# Patient Record
Sex: Female | Born: 1965
Health system: Southern US, Community
[De-identification: ages and names within clinical notes are randomized; demographics above are authoritative.]

## PROBLEM LIST (undated history)

## (undated) DIAGNOSIS — T7840XA Allergy, unspecified, initial encounter: Secondary | ICD-10-CM

## (undated) DIAGNOSIS — K3580 Unspecified acute appendicitis: Secondary | ICD-10-CM

## (undated) DIAGNOSIS — G43109 Migraine with aura, not intractable, without status migrainosus: Secondary | ICD-10-CM

## (undated) DIAGNOSIS — R519 Headache, unspecified: Secondary | ICD-10-CM

## (undated) DIAGNOSIS — R51 Headache: Secondary | ICD-10-CM

## (undated) DIAGNOSIS — M1990 Unspecified osteoarthritis, unspecified site: Secondary | ICD-10-CM

## (undated) DIAGNOSIS — R7989 Other specified abnormal findings of blood chemistry: Secondary | ICD-10-CM

## (undated) DIAGNOSIS — R011 Cardiac murmur, unspecified: Secondary | ICD-10-CM

## (undated) HISTORY — PX: FOREARM SURGERY: SHX651

## (undated) HISTORY — DX: Other specified abnormal findings of blood chemistry: R79.89

## (undated) HISTORY — DX: Migraine with aura, not intractable, without status migrainosus: G43.109

## (undated) HISTORY — PX: APPENDECTOMY: SHX54

## (undated) HISTORY — DX: Unspecified osteoarthritis, unspecified site: M19.90

## (undated) HISTORY — DX: Allergy, unspecified, initial encounter: T78.40XA

## (undated) HISTORY — DX: Unspecified acute appendicitis: K35.80

## (undated) HISTORY — PX: TONSILLECTOMY: SUR1361

## (undated) HISTORY — PX: TUBAL LIGATION: SHX77

## (undated) HISTORY — PX: WISDOM TOOTH EXTRACTION: SHX21

## (undated) HISTORY — DX: Headache: R51

## (undated) HISTORY — DX: Headache, unspecified: R51.9

---

## 2006-04-02 ENCOUNTER — Ambulatory Visit (HOSPITAL_COMMUNITY): Admission: RE | Admit: 2006-04-02 | Discharge: 2006-04-02 | Payer: Self-pay | Admitting: Internal Medicine

## 2010-04-24 ENCOUNTER — Encounter: Payer: Self-pay | Admitting: Internal Medicine

## 2010-05-18 ENCOUNTER — Inpatient Hospital Stay (INDEPENDENT_AMBULATORY_CARE_PROVIDER_SITE_OTHER)
Admission: RE | Admit: 2010-05-18 | Discharge: 2010-05-18 | Disposition: A | Discharge status: Home / Self Care | Payer: 59 | Source: Ambulatory Visit | Attending: Family Medicine | Admitting: Family Medicine

## 2010-05-18 ENCOUNTER — Ambulatory Visit (INDEPENDENT_AMBULATORY_CARE_PROVIDER_SITE_OTHER): Payer: 59

## 2010-05-18 DIAGNOSIS — R071 Chest pain on breathing: Secondary | ICD-10-CM

## 2010-05-18 LAB — CBC
HCT: 35.8 % — ABNORMAL LOW (ref 36.0–46.0)
Hemoglobin: 12.2 g/dL (ref 12.0–15.0)
MCH: 30.3 pg (ref 26.0–34.0)
MCV: 88.8 fL (ref 78.0–100.0)
Platelets: 309 10*3/uL (ref 150–400)
RDW: 13.4 % (ref 11.5–15.5)

## 2010-05-18 LAB — POCT I-STAT, CHEM 8
BUN: 13 mg/dL (ref 6–23)
Chloride: 104 mEq/L (ref 96–112)
Glucose, Bld: 85 mg/dL (ref 70–99)
Hemoglobin: 13.3 g/dL (ref 12.0–15.0)
Sodium: 141 mEq/L (ref 135–145)
TCO2: 24 mmol/L (ref 0–100)

## 2010-12-12 ENCOUNTER — Inpatient Hospital Stay (INDEPENDENT_AMBULATORY_CARE_PROVIDER_SITE_OTHER)
Admission: RE | Admit: 2010-12-12 | Discharge: 2010-12-12 | Disposition: A | Discharge status: Home / Self Care | Payer: 59 | Source: Ambulatory Visit | Attending: Family Medicine | Admitting: Family Medicine

## 2010-12-12 DIAGNOSIS — J069 Acute upper respiratory infection, unspecified: Secondary | ICD-10-CM

## 2011-05-29 ENCOUNTER — Encounter (HOSPITAL_COMMUNITY): Payer: Self-pay

## 2011-05-29 ENCOUNTER — Emergency Department (INDEPENDENT_AMBULATORY_CARE_PROVIDER_SITE_OTHER)
Admission: EM | Admit: 2011-05-29 | Discharge: 2011-05-29 | Disposition: A | Discharge status: Home / Self Care | Payer: 59 | Source: Home / Self Care | Attending: Family Medicine | Admitting: Family Medicine

## 2011-05-29 DIAGNOSIS — J310 Chronic rhinitis: Secondary | ICD-10-CM

## 2011-05-29 MED ORDER — FLUTICASONE PROPIONATE 50 MCG/ACT NA SUSP: 2.0000 | Freq: Every day | NASAL | Status: DC

## 2011-05-29 NOTE — ED Notes (Signed)
C/o URi type syx for past 3-4 days; no, minimal relief w OTC medications

## 2011-05-29 NOTE — ED Provider Notes (Signed)
History     CSN: [id number]  Arrival date & time 05/29/11  1611   First MD Initiated Contact with Patient 05/29/11 1623      Chief Complaint  Patient presents with  . URI    (Consider location/radiation/quality/duration/timing/severity/associated sxs/prior treatment) HPI Comments: Rayven presents for evaluation of URI symptoms. she reports nasal congestion, postnasal drainage, sinus pressure, and headache. She denies any fever. She does report minimal cough that is nonproductive.  Patient is a 46 y.o. female presenting with URI. The history is provided by the patient.  URI The primary symptoms include sore throat, cough and myalgias. Primary symptoms do not include fever or ear pain. The current episode started 3 to 5 days ago. This is a new problem. The problem has not changed since onset. The sore throat began more than 2 days ago. The sore throat has been unchanged since its onset. The sore throat is mild in intensity. The sore throat is not accompanied by trouble swallowing.  The cough began 3 to 5 days ago. The cough is non-productive.  Symptoms associated with the illness include sinus pressure and congestion. The illness is not associated with chills, facial pain or rhinorrhea.    History reviewed. No pertinent past medical history.  Past Surgical History  Procedure Date  . Tonsillectomy   . Tubal ligation     History reviewed. No pertinent family history.  History  Substance Use Topics  . Smoking status: Never Smoker   . Smokeless tobacco: Not on file  . Alcohol Use: No    OB History    Grav Para Term Preterm Abortions TAB SAB Ect Mult Living                  Review of Systems  Constitutional: Negative.  Negative for fever and chills.  HENT: Positive for congestion, sore throat, sneezing, postnasal drip and sinus pressure. Negative for ear pain, rhinorrhea and trouble swallowing.   Eyes: Negative.   Respiratory: Positive for cough.   Cardiovascular:  Negative.   Gastrointestinal: Negative.   Genitourinary: Negative.   Musculoskeletal: Positive for myalgias.  Skin: Negative.   Neurological: Negative.     Allergies  Codeine and Sulfa antibiotics  Home Medications   Current Outpatient Rx  Name Route Sig Dispense Refill  . FLUTICASONE PROPIONATE 50 MCG/ACT NA SUSP Nasal Place 2 sprays into the nose daily. 16 g 2    BP 115/71  Pulse 68  Temp(Src) 97.9 F (36.6 C) (Oral)  Resp 16  SpO2 100%  LMP 05/24/2011  Physical Exam  Nursing note and vitals reviewed. Constitutional: She is oriented to person, place, and time. She appears well-developed and well-nourished.  HENT:  Head: Normocephalic and atraumatic.  Right Ear: Tympanic membrane is retracted.  Left Ear: Tympanic membrane is retracted.  Mouth/Throat: Uvula is midline, oropharynx is clear and moist and mucous membranes are normal.  Eyes: EOM are normal.  Neck: Normal range of motion.  Pulmonary/Chest: Effort normal and breath sounds normal. She has no decreased breath sounds. She has no wheezes. She has no rhonchi.  Musculoskeletal: Normal range of motion.  Neurological: She is alert and oriented to person, place, and time.  Skin: Skin is warm and dry.  Psychiatric: Her behavior is normal.    ED Course  Procedures (including critical care time)  Labs Reviewed - No data to display No results found.   1. Rhinitis       MDM  rx given for fluticasone  Richardo Priest, MD 05/29/11 1755

## 2011-05-29 NOTE — Discharge Instructions (Signed)
Use nasal spray as directed. Use an over the counter cough syrup, such as Delsym, Robitussin DM, or Mucinex DM, as needed and as directed. I recommend aggressive pain and/or fever control with acetaminophen (Tylenol) and/or ibuprofen. You may use these together, alternating them every 4 hours, or individually, every 8 hours. For example, take acetaminophen 500 to 1000 mg at 12 noon, then 600 to 800 mg of ibuprofen at 4 pm, then acetaminophen at 8 pm, etc. Also, stay hydrated with clear liquids. Return to care should your symptoms not improve, or worsen in any way.

## 2011-07-26 ENCOUNTER — Emergency Department (HOSPITAL_COMMUNITY): Payer: 59

## 2011-07-26 ENCOUNTER — Other Ambulatory Visit: Payer: Self-pay

## 2011-07-26 ENCOUNTER — Observation Stay (HOSPITAL_COMMUNITY)
Admission: EM | Admit: 2011-07-26 | Discharge: 2011-07-27 | Disposition: A | Discharge status: Home / Self Care | Payer: 59 | Attending: Family Medicine | Admitting: Family Medicine

## 2011-07-26 ENCOUNTER — Encounter (HOSPITAL_COMMUNITY): Payer: Self-pay

## 2011-07-26 ENCOUNTER — Observation Stay (HOSPITAL_COMMUNITY): Payer: 59

## 2011-07-26 DIAGNOSIS — R0602 Shortness of breath: Secondary | ICD-10-CM | POA: Insufficient documentation

## 2011-07-26 DIAGNOSIS — R0789 Other chest pain: Secondary | ICD-10-CM

## 2011-07-26 DIAGNOSIS — M79609 Pain in unspecified limb: Secondary | ICD-10-CM | POA: Insufficient documentation

## 2011-07-26 DIAGNOSIS — R079 Chest pain, unspecified: Principal | ICD-10-CM | POA: Insufficient documentation

## 2011-07-26 DIAGNOSIS — R110 Nausea: Secondary | ICD-10-CM | POA: Insufficient documentation

## 2011-07-26 HISTORY — DX: Cardiac murmur, unspecified: R01.1

## 2011-07-26 LAB — CARDIAC PANEL(CRET KIN+CKTOT+MB+TROPI)
CK, MB: 2.8 ng/mL (ref 0.3–4.0)
Total CK: 128 U/L (ref 7–177)

## 2011-07-26 LAB — CBC
HCT: 36.1 % (ref 36.0–46.0)
MCH: 29.3 pg (ref 26.0–34.0)
MCHC: 33.8 g/dL (ref 30.0–36.0)
MCV: 86.6 fL (ref 78.0–100.0)
Platelets: 300 10*3/uL (ref 150–400)
RDW: 14 % (ref 11.5–15.5)
WBC: 6.5 10*3/uL (ref 4.0–10.5)

## 2011-07-26 LAB — POCT I-STAT TROPONIN I: Troponin i, poc: 0 ng/mL (ref 0.00–0.08)

## 2011-07-26 MED ORDER — ACETAMINOPHEN 325 MG PO TABS
650.0000 mg | ORAL_TABLET | ORAL | Status: DC | PRN
  Administered 2011-07-27: 650 mg via ORAL
  Filled 2011-07-26: qty 2

## 2011-07-26 MED ORDER — ASPIRIN 81 MG PO CHEW
324.0000 mg | CHEWABLE_TABLET | Freq: Once | ORAL | Status: AC
  Administered 2011-07-26: 324 mg via ORAL
  Filled 2011-07-26: qty 4

## 2011-07-26 MED ORDER — MORPHINE SULFATE 2 MG/ML IJ SOLN
2.0000 mg | Freq: Once | INTRAMUSCULAR | Status: AC
  Administered 2011-07-26: 2 mg via INTRAVENOUS
  Filled 2011-07-26: qty 1

## 2011-07-26 MED ORDER — MORPHINE SULFATE 2 MG/ML IJ SOLN
2.0000 mg | INTRAMUSCULAR | Status: DC | PRN
  Administered 2011-07-26: 2 mg via INTRAVENOUS

## 2011-07-26 MED ORDER — POTASSIUM CHLORIDE CRYS ER 20 MEQ PO TBCR
40.0000 meq | EXTENDED_RELEASE_TABLET | Freq: Once | ORAL | Status: AC
  Administered 2011-07-26: 40 meq via ORAL

## 2011-07-26 MED ORDER — NITROGLYCERIN 0.4 MG SL SUBL: 0.4000 mg | SUBLINGUAL_TABLET | SUBLINGUAL | Status: DC | PRN

## 2011-07-26 MED ORDER — SODIUM CHLORIDE 0.9 % IV SOLN: 250.0000 mL | INTRAVENOUS | Status: DC | PRN

## 2011-07-26 MED ORDER — POTASSIUM CHLORIDE CRYS ER 20 MEQ PO TBCR
EXTENDED_RELEASE_TABLET | ORAL | Status: AC
Start: 2011-07-26 — End: 2011-07-26
  Filled 2011-07-26: qty 2

## 2011-07-26 MED ORDER — ENOXAPARIN SODIUM 40 MG/0.4ML ~~LOC~~ SOLN
40.0000 mg | SUBCUTANEOUS | Status: DC
  Administered 2011-07-26: 40 mg via SUBCUTANEOUS
  Filled 2011-07-26 (×2): qty 0.4

## 2011-07-26 MED ORDER — MORPHINE SULFATE 2 MG/ML IJ SOLN
INTRAMUSCULAR | Status: AC
  Filled 2011-07-26: qty 1

## 2011-07-26 MED ORDER — ONDANSETRON HCL 4 MG/2ML IJ SOLN: 4.0000 mg | Freq: Four times a day (QID) | INTRAMUSCULAR | Status: DC | PRN

## 2011-07-26 MED ORDER — ASPIRIN EC 81 MG PO TBEC
81.0000 mg | DELAYED_RELEASE_TABLET | Freq: Every day | ORAL | Status: DC
  Administered 2011-07-27: 81 mg via ORAL
  Filled 2011-07-26: qty 1

## 2011-07-26 MED ORDER — SODIUM CHLORIDE 0.9 % IJ SOLN: 3.0000 mL | INTRAMUSCULAR | Status: DC | PRN

## 2011-07-26 MED ORDER — NITROGLYCERIN 0.4 MG SL SUBL
0.4000 mg | SUBLINGUAL_TABLET | SUBLINGUAL | Status: DC | PRN
  Administered 2011-07-26 (×2): 0.4 mg via SUBLINGUAL
  Filled 2011-07-26 (×3): qty 25

## 2011-07-26 MED ORDER — SODIUM CHLORIDE 0.9 % IJ SOLN
3.0000 mL | Freq: Two times a day (BID) | INTRAMUSCULAR | Status: DC
  Administered 2011-07-26 – 2011-07-27 (×2): 3 mL via INTRAVENOUS

## 2011-07-26 NOTE — ED Notes (Signed)
Pt. Reports  Chest pain and pressure unreleaved by nitro "slight headache and little headedness for a few minutes" with previous dose of nitro.

## 2011-07-26 NOTE — ED Provider Notes (Signed)
History     CSN: [id number]  Arrival date & time 07/26/11  1504   First MD Initiated Contact with Patient 07/26/11 1526      Chief Complaint  Patient presents with  . Chest Pain    (Consider location/radiation/quality/duration/timing/severity/associated sxs/prior treatment) Patient is a 46 y.o. female presenting with chest pain. The history is provided by the patient.  Chest Pain The chest pain began 1 - 2 weeks ago. Duration of episode(s) is 30 minutes. Chest pain occurs frequently (up to 10x/day). The chest pain is unchanged. The pain is associated with exertion. At its most intense, the pain is at 10/10. The pain is currently at 5/10. The severity of the pain is moderate. The quality of the pain is described as pressure-like and heavy. The pain radiates to the left arm. Chest pain is worsened by stress and exertion. Primary symptoms include shortness of breath and nausea. Pertinent negatives for primary symptoms include no fatigue, no cough, no abdominal pain and no vomiting.  Pertinent negatives for associated symptoms include no diaphoresis, no lower extremity edema, no near-syncope, no paroxysmal nocturnal dyspnea and no weakness. She tried nothing for the symptoms. Risk factors include no known risk factors.  Pertinent negatives for past medical history include no CAD, no cancer, no diabetes, no hyperlipidemia, no hypertension, no MI and no PE.  Pertinent negatives for family medical history include: no CAD in family and no early MI in family. Procedure history comments: none.     Past Medical History  Diagnosis Date  . Heart murmur     Past Surgical History  Procedure Date  . Tonsillectomy   . Tubal ligation   . Tubal ligation   . Forearm surgery     No family history on file.  History  Substance Use Topics  . Smoking status: Never Smoker   . Smokeless tobacco: Not on file  . Alcohol Use: No    OB History    Grav Para Term Preterm Abortions TAB SAB Ect Mult  Living                  Review of Systems  Constitutional: Negative for diaphoresis and fatigue.  HENT: Negative for neck pain.   Respiratory: Positive for chest tightness and shortness of breath. Negative for cough.   Cardiovascular: Positive for chest pain. Negative for near-syncope.  Gastrointestinal: Positive for nausea. Negative for vomiting, abdominal pain and diarrhea.  Genitourinary: Negative for dysuria.  Skin: Negative for rash.  Neurological: Negative for weakness and headaches.  All other systems reviewed and are negative.    Allergies  Sulfa antibiotics and Codeine  Home Medications   Current Outpatient Rx  Name Route Sig Dispense Refill  . DM-GUAIFENESIN ER 30-600 MG PO TB12 Oral Take 1 tablet by mouth every 12 (twelve) hours.    . ADULT MULTIVITAMIN W/MINERALS CH Oral Take 1 tablet by mouth daily.    Marland Kitchen VITAMIN B-12 1000 MCG PO TABS Oral Take 1,000 mcg by mouth daily.      BP 126/62  Pulse 60  Temp(Src) 98.5 F (36.9 C) (Oral)  Resp 20  Ht 5\' 7"  (1.702 m)  Wt 185 lb (83.915 kg)  BMI 28.97 kg/m2  SpO2 100%  LMP 07/19/2011  Physical Exam  Nursing note and vitals reviewed. Constitutional: She is oriented to person, place, and time. She appears well-developed and well-nourished.  HENT:  Head: Normocephalic and atraumatic.  Eyes: EOM are normal.  Neck: Normal range of motion.  Cardiovascular: Normal  rate, regular rhythm, normal heart sounds and intact distal pulses.   Pulmonary/Chest: Effort normal and breath sounds normal. No respiratory distress.  Abdominal: Soft. There is no tenderness.  Musculoskeletal: Normal range of motion. She exhibits no edema and no tenderness.  Neurological: She is alert and oriented to person, place, and time.  Skin: Skin is warm and dry.  Psychiatric: She has a normal mood and affect.    ED Course  Procedures (including critical care time)  Date: 07/26/2011  Rate: 61  Rhythm: normal sinus rhythm  QRS Axis: normal   Intervals: PR prolonged  ST/T Wave abnormalities: normal  Conduction Disutrbances:none  Narrative Interpretation:   Old EKG Reviewed: none available   Results for orders placed during the hospital encounter of 07/26/11  POCT I-STAT, CHEM 8      Component Value Range   Sodium 141  135 - 145 (mEq/L)   Potassium 3.3 (*) 3.5 - 5.1 (mEq/L)   Chloride 103  96 - 112 (mEq/L)   BUN 9  6 - 23 (mg/dL)   Creatinine, Ser 1.61  0.50 - 1.10 (mg/dL)   Glucose, Bld 90  70 - 99 (mg/dL)   Calcium, Ion 0.96  0.45 - 1.32 (mmol/L)   TCO2 28  0 - 100 (mmol/L)   Hemoglobin 13.3  12.0 - 15.0 (g/dL)   HCT 40.9  81.1 - 91.4 (%)  POCT I-STAT TROPONIN I      Component Value Range   Troponin i, poc 0.00  0.00 - 0.08 (ng/mL)   Comment 3              Labs Reviewed  POCT I-STAT, CHEM 8 - Abnormal; Notable for the following:    Potassium 3.3 (*)    All other components within normal limits  POCT I-STAT TROPONIN I   Dg Chest Portable 1 View  07/26/2011  *RADIOLOGY REPORT*  Clinical Data: Left-sided chest pain  PORTABLE CHEST - 1 VIEW  Comparison: 05/18/2010  Findings: Ill-defined opacities seen in the left apex.  The cardiopericardial silhouette is within normal limits for size. Imaged bony structures of the thorax are intact. Telemetry leads overlie the chest.  IMPRESSION: New ill-defined left apical opacity. This may be related to superimposed density. A follow-up PA and lateral view upright chest x-ray would likely prove helpful to further evaluate. Alternatively, follow-up CT chest without contrast recommended to exclude underlying mass lesion.  I discussed these findings by telephone with Dr. Jeraldine Loots at 1630 hours on 07/26/2011.  Original Report Authenticated By: ERIC A. MANSELL, M.D.     1. Chest pain       MDM  Patient is a 46 year old female presents with intermittent chest pain over the last 1-2 weeks. She describes a pain that typically comes on with exertion or stressful activities. She states  it typically happens up to 10 times a day now. Most recently she had an episode last night where she began to walk on the treadmill and had what she describes as a left-sided chest heaviness or pressure that radiated to her left arm. He continued to walk 30 minutes that felt much more fatigued with doing so. Subsequently increasingly fatigued throughout the rest and continued to have chest pain when she went to bed. Currently she endorses is a 5/10 chest pressure. Associated symptoms have included some nausea but no diaphoresis.  She has no cardiac risk factors including known hypertension, diabetes, hyperlipidemia, family history of CAD, but there is a concerning story for unstable angina.  Here her EKG is without ischemia and her first set of cardiac enzymes is normal.  Given the patient's concerning story for unstable angina cardiology was contacted. They felt the patient stable for a hospitalist service. Patient does not have a PCP and I'm concerned about her getting close followup. Consult to be made to family medicine for further workup. They will admit.       Donnamarie Poag, MD 07/27/11 0005

## 2011-07-26 NOTE — ED Notes (Signed)
Pt presents with 2 week h/o intermittent L sided chest pain.  Pt reports pain has been sharp, felt like pressure with L arm tingling and heavy feeling.  Intermittent nausea and vomiting.

## 2011-07-26 NOTE — ED Notes (Signed)
Admitting Doc at bedside.

## 2011-07-26 NOTE — ED Notes (Signed)
Pt. Reports pain and discomfort relieved by morphine, 0/10.  Hospitalitis at bedside. NAD. A.O. X 4.

## 2011-07-26 NOTE — H&P (Signed)
[facility] Admission History and Physical  Patient name: Denise Reese Medical record number: [id number] Date of birth: 02-10-1966 Age: 46 y.o. Gender: female  Primary Care Provider: No primary provider on file.  Chief Complaint: chest pain History of Present Illness: Denise Reese is a 46 y.o. year old female with no past medical history who presented to the ED with intermitted chest pain x2 weeks.  The pain is left-sided and described as "heaviness."  Has been occuring intermittently over the last several weeks, but has worsened in the last 2 days.  Describes the chest heaviness last night after she started walking on the treadmill. This resolved with rest, but recurred on the morning of admission with activity.  The chest pain this morning was associated with left arm heaviness/tingling, nausea, SOB, and lightheadedness.  No diaphoresis.  She also describes intermittent n/v, difficulty getting a deep breath, and lightheadedness over the last 2 weeks, but has not noticed if these are always associated with the chest pain.  No association with food.  Has been able to eat and drink well.  Vomiting x4 in the last week, none in the last 24 hours.  No fevers, chills, cough, leg pain, leg edema, immobilization, weight loss.  No family history of CAD, MI, CVA, HTN, DM.   Has had similar pain in past (12 years ago, 2 years ago, and 1 year ago).  Has been seen for it at urgent cares and her heart has always been normal.  ED Course: Received SL nitro x3 which decreased pain to a 3/10, 2mg  IV morphine decreased to 1-2/10.  Also received ASA 324mg .  EKG showed 1st degree AV-block and POC troponin was negative.  Past Medical History: Past Medical History  Diagnosis Date  . Heart murmur     Past Surgical History: Past Surgical History  Procedure Date  . Tonsillectomy   . Tubal ligation   . Tubal ligation   . Forearm surgery     Social History: History   Social  History  . Marital Status: Married    Spouse Name: N/A    Number of Children: N/A  . Years of Education: N/A   Social History Main Topics  . Smoking status: Never Smoker   . Smokeless tobacco: None  . Alcohol Use: No  . Drug Use:   . Sexually Active:    Other Topics Concern  . None   Social History Narrative  . None    Family History: No family history on file.  Allergies: Allergies  Allergen Reactions  . Sulfa Antibiotics Shortness Of Breath and Swelling  . Codeine Itching    Current Facility-Administered Medications  Medication Dose Route Frequency Provider Last Rate Last Dose  . aspirin chewable tablet 324 mg  324 mg Oral Once Donnamarie Poag, MD   324 mg at 07/26/11 1607  . morphine 2 MG/ML injection 2 mg  2 mg Intravenous Once Donnamarie Poag, MD   2 mg at 07/26/11 1805  . nitroGLYCERIN (NITROSTAT) SL tablet 0.4 mg  0.4 mg Sublingual Q5 Min x 3 PRN Donnamarie Poag, MD   0.4 mg at 07/26/11 1724   Current Outpatient Prescriptions  Medication Sig Dispense Refill  . dextromethorphan-guaiFENesin (MUCINEX DM) 30-600 MG per 12 hr tablet Take 1 tablet by mouth every 12 (twelve) hours.      . Multiple Vitamin (MULITIVITAMIN WITH MINERALS) TABS Take 1 tablet by mouth daily.      . vitamin B-12 (CYANOCOBALAMIN) 1000 MCG tablet Take  1,000 mcg by mouth daily.       Review Of Systems: Per HPI with the following additions: none Otherwise 12 point review of systems was performed and was unremarkable.  Physical Exam: Pulse: 64  Blood Pressure: 100/68 RR: 20   O2: 100 on RA Temp: 98.5  General: alert, cooperative, appears stated age and no distress HEENT: PERRLA, extra ocular movement intact, sclera clear, anicteric, oropharynx clear, no lesions, neck supple with midline trachea and no LAD Heart: S1, S2 normal, no murmur, rub or gallop, regular rate and rhythm Lungs: clear to auscultation, no wheezes or rales and unlabored breathing Abdomen: abdomen is soft without  significant tenderness, masses, organomegaly or guarding Extremities: extremities normal, atraumatic, no cyanosis or edema Skin:no rashes Neurology: normal without focal findings, mental status, speech normal, alert and oriented x3, PERLA and cranial nerves 2-12 intact  Labs and Imaging: Na 141 K 3.3 Cl 103 BUN 9 Cr 1.10 Ca++ 1.25 Glu 90 TCO2 28 Hgb 13.3 Hct 39.0  POC trop negative  EKG: 1st degree AV-block, nsr  CXR:  New ill-defined left apical opacity. This may be related to  superimposed density. A follow-up PA and lateral view upright chest  x-ray would likely prove helpful to further evaluate.  Alternatively, follow-up CT chest without contrast recommended to  exclude underlying mass lesion.   Assessment and Plan: Denise Reese is a 46 y.o. year old female presenting with chest pain concerning for ACS.  # Chest Pain: History concerning for cardiac origin.  PE unlikely given no tachycardia, hypoxia, or pleuritic chest pain.  GI possible with the nausea and vomiting, but seems unlikely.  MSK causes also seem unlikely given that it is not reproducible on exam.  Psych is possible, but does not really fit picture for panic attacks.  TIMI score of 1. - will admit to step down as still having mild chest pain after SL nitro and IV morphine - SL nitro and IV morphine for pain - zofran prn for nausea - cycle cardiac enzymes - repeat EKG in the morning - will check risk stratification labs  # Possible lung opacity: No pulmonary symptoms or fevers. - 2V CXR  FEN/GI: heart healthy diet, saline lock IV, K-dur x1 Prophylaxis: lovenox Disposition: admit to stepdown  BOOTH, ERIN 07/26/2011, 7:21 PM   I have seen and examined the above pt with Dr Elwyn Reach.  I agree with the above physical exam and assessment and plan as per above.   Denise Reese S. Edmonia James, MD [facility] PGY-3

## 2011-07-26 NOTE — ED Notes (Signed)
Please contact pt's husband Jena Gauss [phone] with any updates or change of status.

## 2011-07-27 LAB — CARDIAC PANEL(CRET KIN+CKTOT+MB+TROPI)
CK, MB: 2.5 ng/mL (ref 0.3–4.0)
CK, MB: 3 ng/mL (ref 0.3–4.0)
Relative Index: 2.2 (ref 0.0–2.5)
Total CK: 119 U/L (ref 7–177)

## 2011-07-27 LAB — CBC
HCT: 34.9 % — ABNORMAL LOW (ref 36.0–46.0)
MCH: 29.3 pg (ref 26.0–34.0)
MCV: 86.6 fL (ref 78.0–100.0)
Platelets: 280 10*3/uL (ref 150–400)
RBC: 4.03 MIL/uL (ref 3.87–5.11)

## 2011-07-27 LAB — BASIC METABOLIC PANEL
BUN: 10 mg/dL (ref 6–23)
CO2: 25 mEq/L (ref 19–32)
Calcium: 9.1 mg/dL (ref 8.4–10.5)
Chloride: 102 mEq/L (ref 96–112)
Creatinine, Ser: 0.92 mg/dL (ref 0.50–1.10)
Glucose, Bld: 101 mg/dL — ABNORMAL HIGH (ref 70–99)

## 2011-07-27 LAB — MRSA PCR SCREENING: MRSA by PCR: NEGATIVE

## 2011-07-27 LAB — LIPID PANEL
HDL: 56 mg/dL (ref >39)
LDL Cholesterol: 74 mg/dL (ref 0–99)
Total CHOL/HDL Ratio: 2.8 RATIO
VLDL: 24 mg/dL (ref 0–40)

## 2011-07-27 LAB — TSH: TSH: 6.236 u[IU]/mL — ABNORMAL HIGH (ref 0.350–4.500)

## 2011-07-27 MED ORDER — OMEPRAZOLE 40 MG PO CPDR: 40.0000 mg | DELAYED_RELEASE_CAPSULE | Freq: Every day | ORAL | Status: DC

## 2011-07-27 NOTE — Progress Notes (Signed)
Subjective: This is a 46 yo woman presenting for 2 weeks of progressive left-sided chest pain, described as heavy and radiating throughout the left upper extremity, especially worse in the past 2 days. At that time she also described shortness of breath without any pain. After receiving sublingual Nitro her pain was noted to decrease in severity from 7 to 3.  This was further relieved by morphine. This morning she has no complaints of pain or dyspnea and feels her condition has much improved.    Objective: Vital signs in last 24 hours: Temp:  [97.3 F (36.3 C)-98.5 F (36.9 C)] 97.7 F (36.5 C) (04/25 0846) Pulse Rate:  [60-94] 61  (04/25 0846) Resp:  [18-20] 18  (04/25 0846) BP: (100-126)/(53-77) 113/63 mmHg (04/25 0846) SpO2:  [95 %-100 %] 96 % (04/25 0846) Weight:  [185 lb (83.915 kg)-195 lb 1.7 oz (88.5 kg)] 193 lb 2 oz (87.6 kg) (04/25 0733) Weight change:  Last BM Date: 07/26/11  Intake/Output from previous day: 04/24 0701 - 04/25 0700 In: 320 [P.O.:220; I.V.:100] Out: 1100 [Urine:1100] Intake/Output this shift:    General appearance: alert, cooperative and appears stated age Resp: clear to auscultation bilaterally Cardio: regular rate and rhythm, S1, S2 normal, no murmur, click, rub or gallop GI: soft, non-tender; bowel sounds normal; no masses,  no organomegaly Pulses: 2+ and symmetric  Lab Results:  Basename 07/27/11 0256 07/26/11 2137  WBC 6.2 6.5  HGB 11.8* 12.2  HCT 34.9* 36.1  PLT 280 300   BMET  Basename 07/27/11 0256 07/26/11 2137 07/26/11 1612  NA 136 -- 141  K 3.6 -- 3.3*  CL 102 -- 103  CO2 25 -- --  GLUCOSE 101* -- 90  BUN 10 -- 9  CREATININE 0.92 0.89 --  CALCIUM 9.1 -- --    Studies/Results: Dg Chest 2 View  07/26/2011  *RADIOLOGY REPORT*  Clinical Data: Left-sided upper chest pain for the past 2 weeks.  CHEST - 2 VIEW  Comparison: Portable chest 07/26/2011 at 1606 hours  Findings: Focal increased density noted on the previous study in the  left apical region appears to extend outside of the chest and likely represents hair or clothing shadow.  Normal heart size and pulmonary vascularity.  No focal airspace consolidation in the lungs.  No blunting of costophrenic angles.  IMPRESSION: Shadow over the left apical region and extends outside the chest and likely represents a hair braid.  No evidence of active pulmonary disease.  Original Report Authenticated By: Marlon Pel, M.D.   Dg Chest Portable 1 View  07/26/2011  *RADIOLOGY REPORT*  Clinical Data: Left-sided chest pain  PORTABLE CHEST - 1 VIEW  Comparison: 05/18/2010  Findings: Ill-defined opacities seen in the left apex.  The cardiopericardial silhouette is within normal limits for size. Imaged bony structures of the thorax are intact. Telemetry leads overlie the chest.  IMPRESSION: New ill-defined left apical opacity. This may be related to superimposed density. A follow-up PA and lateral view upright chest x-ray would likely prove helpful to further evaluate. Alternatively, follow-up CT chest without contrast recommended to exclude underlying mass lesion.  I discussed these findings by telephone with Dr. Jeraldine Loots at 1630 hours on 07/26/2011.  Original Report Authenticated By: ERIC A. MANSELL, M.D.    Medications: I have reviewed the patient's current medications.  Assessment/Plan: This is a 46 yo woman with resolving chest pain. 1. Chest Pain  EKG- 1st degree AV block, no ischemia  Cardiac Enzymes- Troponin I negative x 3, all others  negative x 2  Consider stress test 2. Elevated TSH  Pending Free T3 and T4  No s/s of hypo/hyperthyroidism 3. Abnormal CXR  Opacity noted initially in left upper lung field was determined to be artifact (hair braid).  LOS: 1 day   Durenda Age 07/27/2011, 8:59 AM   PGY 2 Addendum to Student Progress Note: I have seen and examined this patient and agree with Student's note.   S: Patient denies chest pain this am.   O:  BP 119/71   Pulse 69  Temp(Src) 97.7 F (36.5 C) (Oral)  Resp 18  Ht 5\' 7"  (1.702 m)  Wt 193 lb 2 oz (87.6 kg)  BMI 30.25 kg/m2  SpO2 96%  LMP 07/19/2011 General appearance: alert, cooperative and no distress Lungs: clear to auscultation bilaterally Heart: regular rate and rhythm, S1, S2 normal, no murmur, click, rub or gallop Abdomen: soft, non-tender; bowel sounds normal; no masses,  no organomegaly Extremities: extremities normal, atraumatic, no cyanosis or edema Pulses: 2+ and symmetric  A/P: 46 year old female with chest pain:  1) Chest pain- ruled out for AMI, patient without significant risk factors for coronary disease.  Feel this is more likely GI source of pain, will discharge with PPI. Will have her follow up as an outpatient, and consider outpatient stress test if pain continues.  2) Elevated TSH- have ordered T3 and T4, will follow up for need of thyroid medication in clinic.  3) Disposition- Discharge home.   Gurjot Brisco 07/27/2011 1:14 PM

## 2011-07-27 NOTE — H&P (Signed)
[facility]MT[facility] Attending Admission Note  Patient seen and examined by me, discussed with resident team.  Briefly, a 45yoF without known heart disease who presents with intermittent chest "heaviness", onset of this episode Saturday, April 20th while walking on [employer].  Heaviness that radiates to L upper arm; not associated with diaphoresis or nausea.  Went away on Saturday after she rested.  The following day she had onset of sharp left-sided chest pain while in church, different in characteristic from the preceding day.  On Monday April 22, she had nausea and emesis x4 withotu chest pain, but felt somewhat light-headed,  No fever or chills, no cough or dyspnea.  Tuesday evening she attempted to exercise on her home treadmill, was unable to get to her usual speed and then had onset of left sided chest heaviness and arm pain again, which went away after a shower and rest.  Yesterday this pain persisted, and prompted her to present to urgent care in [place] near her work, was recommended for admission.   In ED at [facility] she was given sl NTG x2 as well as morphine, with resolution of the pain.  Has had negative cardiac enzymes and ECGs that are remarkable only for a first degree AV block; no suggestion of ischemia.  This morning with mild headache, no chest discomfort or heaviness.   PE Well appearing, detailed historian, no apparent distress HEENT Neck supple, no adenopathy COR S1S2, no reproducible pain with palpation over sternum, chest wall or with shoulder movements/palpation PULM Clear bilaterally, no rales or wheezes ABD Soft, nontender, nondistended, no masses or megaly.   A/P: Patient who lacks significant risk factors (nonsmoker, no family hx of MI or DM; favorable lipid panel this morning), admitted for chest pain.  Her pretest probability for cardiac disease is low; however, her story for anginal-type pain with onset during physical activity warrants consideration for cardiac causes.  She has  had negative CEs and ECG since admission, and is now chest-pain free.  Would consider whether outpatient exercise stress test would be helpful to further stratify her risk in light of her symptom description.  Paula Compton, MD

## 2011-07-27 NOTE — ED Provider Notes (Signed)
  I performed a history and physical examination of Denise Reese and discussed her management with Dr. Vear Clock.  I agree with the history, physical, assessment, and plan of care, with the following exceptions: None  I was present for the following procedures: None Time Spent in Critical Care of the patient: None Time spent in discussions with the patient and family: 10  Cardiac: 60 sr- normal Pulse ox : 99% ra-normal  I agree with the ECG interpretation   Ivonne Freeburg, Elvis Coil, MD 07/27/11 [phone]

## 2011-07-27 NOTE — Discharge Instructions (Signed)
Chest Pain (Nonspecific) It is often hard to give a specific diagnosis for the cause of chest pain. There is always a chance that your pain could be related to something serious, such as a heart attack or a blood clot in the lungs. You need to follow up with your caregiver for further evaluation. CAUSES   Heartburn.   Pneumonia or bronchitis.   Anxiety or stress.   Inflammation around your heart (pericarditis) or lung (pleuritis or pleurisy).   A blood clot in the lung.   A collapsed lung (pneumothorax). It can develop suddenly on its own (spontaneous pneumothorax) or from injury (trauma) to the chest.   Shingles infection (herpes zoster virus).  The chest wall is composed of bones, muscles, and cartilage. Any of these can be the source of the pain.  The bones can be bruised by injury.   The muscles or cartilage can be strained by coughing or overwork.   The cartilage can be affected by inflammation and become sore (costochondritis).  DIAGNOSIS  Lab tests or other studies, such as X-rays, electrocardiography, stress testing, or cardiac imaging, may be needed to find the cause of your pain.  TREATMENT   Treatment depends on what may be causing your chest pain. Treatment may include:   Acid blockers for heartburn.   Anti-inflammatory medicine.   Pain medicine for inflammatory conditions.   Antibiotics if an infection is present.   You may be advised to change lifestyle habits. This includes stopping smoking and avoiding alcohol, caffeine, and chocolate.   You may be advised to keep your head raised (elevated) when sleeping. This reduces the chance of acid going backward from your stomach into your esophagus.   Most of the time, nonspecific chest pain will improve within 2 to 3 days with rest and mild pain medicine.  HOME CARE INSTRUCTIONS   If antibiotics were prescribed, take your antibiotics as directed. Finish them even if you start to feel better.   For the next few  days, avoid physical activities that bring on chest pain. Continue physical activities as directed.   Do not smoke.   Avoid drinking alcohol.   Only take over-the-counter or prescription medicine for pain, discomfort, or fever as directed by your caregiver.   Follow your caregiver's suggestions for further testing if your chest pain does not go away.   Keep any follow-up appointments you made. If you do not go to an appointment, you could develop lasting (chronic) problems with pain. If there is any problem keeping an appointment, you must call to reschedule.  SEEK MEDICAL CARE IF:   You think you are having problems from the medicine you are taking. Read your medicine instructions carefully.   Your chest pain does not go away, even after treatment.   You develop a rash with blisters on your chest.  SEEK IMMEDIATE MEDICAL CARE IF:   You have increased chest pain or pain that spreads to your arm, neck, jaw, back, or abdomen.   You develop shortness of breath, an increasing cough, or you are coughing up blood.   You have severe back or abdominal pain, feel nauseous, or vomit.   You develop severe weakness, fainting, or chills.   You have a fever.  THIS IS AN EMERGENCY. Do not wait to see if the pain will go away. Get medical help at once. Call your local emergency services (911 in [place] Do not drive yourself to the hospital. MAKE SURE YOU:   Understand these instructions.     Will watch your condition.   Will get help right away if you are not doing well or get worse.  Document Released: 12/28/2004 Document Revised: 03/09/2011 Document Reviewed: 10/24/2007 Exit[employer] Patient Information 2012 [employer]. 

## 2011-07-27 NOTE — Progress Notes (Signed)
Uneventful noc.  Now free of chest pressure since last dose of morphine in ER at 8pm.  Tele showed 3 episodes of P wave with no QRS following.  Otherwise 1st degree HB w/ rate of 50-60 at rest and 80's w/ activity in room.

## 2011-08-03 ENCOUNTER — Ambulatory Visit (INDEPENDENT_AMBULATORY_CARE_PROVIDER_SITE_OTHER): Payer: 59 | Admitting: Family Medicine

## 2011-08-03 ENCOUNTER — Encounter: Payer: Self-pay | Admitting: Family Medicine

## 2011-08-03 VITALS — BP 134/82 | HR 82 | Ht 67.0 in | Wt 199.0 lb

## 2011-08-03 DIAGNOSIS — R7989 Other specified abnormal findings of blood chemistry: Secondary | ICD-10-CM

## 2011-08-03 DIAGNOSIS — R0789 Other chest pain: Secondary | ICD-10-CM

## 2011-08-03 DIAGNOSIS — I10 Essential (primary) hypertension: Secondary | ICD-10-CM

## 2011-08-03 LAB — T4, FREE: Free T4: 1 ng/dL (ref 0.80–1.80)

## 2011-08-03 NOTE — Progress Notes (Addendum)
Subjective:     Patient ID: Denise Reese, female   DOB: [birth year], 46 y.o.   MRN: [id number]  HPI Denise Reese is a pleasant 46 year old woman who presents as a follow-up to a recent overnight admission for chest pain- left sided. Cardiac origin was ruled out by negative cardiac enzymes x 3 and EKG showing only 1st degree heart block.  There was noted to be an elevated TSH to 6. She reports repeated episodes of heaviness in her chest and left arm, which she finds to be associated with emotionally stressful times. She acknowledges taking several supplements including: TaiSlim (metab/energy), B12, B-complex, Glucosamine, Chondroitin, Fish oil, Vit E, Resveratol (metab/energy), and a daily vitamin that promotes metabolism and energy. She has found that relaxing and "redirecting" her thoughts seems to bring relief of this present chest pressure. She has been able to do low-grade aerobic exercise on a treadmill for 30 to 60 minutes without return of symptoms. She has been taking omeprazole 40mg  daily since discharge. She does not notice any difference.  No chest pain at time of visit.  Chest pain is not worse with walking/exercise. Can occur at rest if feeling stressed. PHQ-9 score of 6-  Daily decrease in energy, more than 1/2 of the time problems with appetite, and several days in past 2 weeks has had problems with sleep.  No history of depression.   Review of Systems As stated in HPI.    Objective:   Physical Exam  Constitutional: She is oriented to person, place, and time. She appears well-developed and well-nourished.  HENT:  Head: Normocephalic and atraumatic.  Cardiovascular: Normal rate and regular rhythm.  Exam reveals no gallop and no friction rub.   No murmur heard. Pulmonary/Chest: Effort normal and breath sounds normal. No respiratory distress. She has no wheezes. She has no rales.  Abdominal: Soft. Bowel sounds are normal.  Neurological: She is alert and oriented to person, place, and  time.       Assessment:        Plan:

## 2011-08-03 NOTE — Patient Instructions (Signed)
It was a pleasure seeing you again! It is best if you stop the TaiSlim, Resveratol (already stopped), and Men's multivitamin. As we talked about, the FDA doesn't regulate this and it may have other stimulants or unknown amounts that could be causing your chest pain. Today you will have your thyroid stimulating hormone re-checked to see if there is in fact a problem with how your thyroid is functioning. We will also make an appointment for you to get a treadmill stress test to do a final evaluation for any possible heart-related pain. At a future appointment we can talk about weight loss.

## 2011-08-04 ENCOUNTER — Telehealth: Payer: Self-pay | Admitting: *Deleted

## 2011-08-04 NOTE — Telephone Encounter (Signed)
Patient came by office  asking about lab results and when she needs to come back to follow up with Dr. Edmonia James.  States she has not felt well today . She had a headache earlier today and left work . Took medications and headache went away.  She is feeling ok now. Also she is concerned about a change in her periods for last few month . Periods have been irregular. Advised patient that I will send message to MD to call her about her labs and follow up. Marland Kitchen  Phone  # [phone]

## 2011-08-05 ENCOUNTER — Encounter: Payer: Self-pay | Admitting: Family Medicine

## 2011-08-05 DIAGNOSIS — R079 Chest pain, unspecified: Secondary | ICD-10-CM | POA: Insufficient documentation

## 2011-08-05 DIAGNOSIS — I10 Essential (primary) hypertension: Secondary | ICD-10-CM | POA: Insufficient documentation

## 2011-08-05 DIAGNOSIS — R7989 Other specified abnormal findings of blood chemistry: Secondary | ICD-10-CM | POA: Insufficient documentation

## 2011-08-05 DIAGNOSIS — R0789 Other chest pain: Secondary | ICD-10-CM | POA: Insufficient documentation

## 2011-08-05 HISTORY — DX: Other specified abnormal findings of blood chemistry: R79.89

## 2011-08-05 NOTE — Assessment & Plan Note (Signed)
134/82 at today's appointment- will continue to monitor.

## 2011-08-05 NOTE — Assessment & Plan Note (Signed)
Denise Reese is a 46 year old woman following up on her chest pain which resulted in previous hospital admission.  Inpatient work up negative.  Chest pain is atypical and seems worse with emotional stress.  Pt instructed to stop taking metabolism and energy inducing supplements.  Continue to perform  stress-reducing mental exercises when feeling stressed. Although workup in hospital was reassuring that not cardiac etiology I still feel it best continue work up with outpatient stress test.  Will arrange for this and have pt follow up with me after stress test to review results.  Return sooner if no improvement or if new or worsening of symptoms.

## 2011-08-05 NOTE — Assessment & Plan Note (Signed)
Will recheck TSH- will also obtain T3 and T4 levels.

## 2011-08-06 NOTE — Telephone Encounter (Signed)
Called patient.   Told patient the results are all normal.  I want to see her back in 1-2 weeks or Return sooner if  new or worsening of symptoms. Told her that office staff will be calling her with appointment for outpatient exercise treadmill test.

## 2011-08-10 ENCOUNTER — Telehealth: Payer: Self-pay | Admitting: Family Medicine

## 2011-08-10 NOTE — Telephone Encounter (Signed)
Pt is supposed to have a stress test and she hasn't heard from them yet  - not sure what to do.

## 2011-08-11 NOTE — Telephone Encounter (Signed)
Has this stress test been approved by Dr Jennette Kettle, Darrick Penna, or McDiarmid??Jenny Omdahl, Rodena Medin

## 2011-08-13 NOTE — Telephone Encounter (Signed)
Dr. Jennette Kettle,  This is the patient that I send a note to you about via your inbox last week.  Do agree that she is an appropriate referral for exercise stress test?  Thanks, [employer]

## 2011-08-14 NOTE — Telephone Encounter (Signed)
Denise Reese can you help set up the stress test for this patient, thanks.Random Dobrowski, Rodena Medin

## 2011-08-14 NOTE — Telephone Encounter (Signed)
OK to set up stress test---let [place] Know I have approved. THANKS! Denny Levy

## 2011-08-15 ENCOUNTER — Encounter: Payer: Self-pay | Admitting: Family Medicine

## 2011-08-15 ENCOUNTER — Ambulatory Visit (INDEPENDENT_AMBULATORY_CARE_PROVIDER_SITE_OTHER): Payer: 59 | Admitting: Family Medicine

## 2011-08-15 VITALS — BP 117/76 | HR 68 | Ht 67.0 in | Wt 198.0 lb

## 2011-08-15 DIAGNOSIS — R0789 Other chest pain: Secondary | ICD-10-CM

## 2011-08-15 NOTE — Patient Instructions (Signed)
We will give you the appointment for the exercise stress test.  Continue to do de-stressing activities- such as prayer, christian music, etc.   Return in 1-2 months for annual physical.  Or sooner if needed.

## 2011-08-15 NOTE — Progress Notes (Signed)
  Subjective:    Patient ID: Denise Reese, female    DOB: 08/27/1965, 46 y.o.   MRN: [id number]  HPI Followup chest pain: Patient states that during the past 2 weeks she has not had any chest pain associated with activity. She has had some chest pressure that only occurs when she is feeling stressed-in her work situation or with her daughter in a disagreement. When chest pressure occurs she calms herself down using prayer or distinct music and chest pressure immediately resolves. She worked on her garden for her 5 hours in a row over the weekend and had no chest pain. Has and walking on treadmill and  working in her garden consistently without any chest pain. No episodes of shortness of breath. No diaphoresis. No nausea. No vomiting. Patient denies any symptoms of depression.-PHQ9 obtained at last appointment.   Review of Systems As per above.    Objective:   Physical Exam  Constitutional: She appears well-developed and well-nourished.  HENT:  Head: Normocephalic and atraumatic.  Eyes: Pupils are equal, round, and reactive to light.  Cardiovascular: Normal rate, regular rhythm and normal heart sounds.   No murmur heard. Pulmonary/Chest: Effort normal. No respiratory distress. She has no wheezes. She has no rales. She exhibits no tenderness.  Musculoskeletal: She exhibits no edema.  Neurological: She is alert.  Skin: No rash noted.  Psychiatric: She has a normal mood and affect.          Assessment & Plan:

## 2011-08-16 ENCOUNTER — Telehealth: Payer: Self-pay | Admitting: *Deleted

## 2011-08-16 NOTE — Assessment & Plan Note (Signed)
Atypical chest pain most likely related to stress reaction.  Inpatient work up negative for cardiac cause.  Outpt. Treadmill stress test pending.  Pt to continue to do relaxation techniques to relieve chest pressure.  Reviewed red flags for return- chest pain with activity, relieved with rest, substernal, etc.  Will review stress test results when they are available.  Pt to return in 1 month for annual wellness exam.

## 2011-08-16 NOTE — Telephone Encounter (Signed)
Called patient and informed her of appointment for ETT on 09/08/11 at 11:15am. She was given the blue information sheet at her office visit yesterday.Phinneas Shakoor, Rodena Medin

## 2011-09-08 ENCOUNTER — Ambulatory Visit (INDEPENDENT_AMBULATORY_CARE_PROVIDER_SITE_OTHER): Payer: 59 | Admitting: Family Medicine

## 2011-09-08 ENCOUNTER — Ambulatory Visit (HOSPITAL_COMMUNITY)
Admission: RE | Admit: 2011-09-08 | Discharge: 2011-09-08 | Disposition: A | Discharge status: Home / Self Care | Payer: 59 | Source: Ambulatory Visit | Attending: Family Medicine | Admitting: Family Medicine

## 2011-09-08 DIAGNOSIS — R0789 Other chest pain: Secondary | ICD-10-CM | POA: Insufficient documentation

## 2011-09-08 NOTE — Progress Notes (Signed)
Patient ID: Denise Reese, female   DOB: 1966/03/20, 45 y.o.   MRN: [id number] The Graded Exercise Test was EQUIVOCAL / POSITIVE for signs of ischemia. The patient achieved and exercise level of  12 mets. Predicted maximal  heart rate  was  176 85% - 90% Maximal  Heart Rate was : 149  Pretest Likelihood that this patient has coronary artery disease (CAD), given their symptoms and risk profile was estimated to be  30% Post test likelihood of this patient having CAD was estimated to be  45%.  TEST SPECIFICS: Maximum heart rate achieved  176 Exercise effort  GOOD Symptoms with exercise?  NONE Blood pressure response to exercise?  ATYPICAL WITH A DROP IN DIASTOLIC BLOOD PRESSURE FROM A STARTING 118/89 TO 150/63 DURING STAGE 3 AT MAX hr (169);  IN RECOVERY MINUTE ,2 THE BP WAS 155/72 AT PULSE 95. This is a fairly  typical systolic response, although mildly attenuated with an increase of 32 points BP for 12 METS; there was more of a drop in diastolic than expected although the initial BP was read as 118/90 the first BP recorded on strip was actually 113/69 so this may be artifact. EKG response to exercise:  Upsloping St depression that did not meet criteria was noted in initial stage three. Mid stage three there was horizontal ST depression in V4 and V5 that  was right at 2 mm depression.  COMMENTS:  Review of her EKG and alarm reviews from her inpatient stay showed first degree AV block at rest with a few episodes of Second degree type 1 (wenkebach) on the monitor,  There are enough items in her evaluation that make me concerned. I recommend she be further evaluated by Cardiology.

## 2011-09-14 ENCOUNTER — Telehealth: Payer: Self-pay | Admitting: *Deleted

## 2011-09-14 ENCOUNTER — Other Ambulatory Visit: Payer: Self-pay | Admitting: Family Medicine

## 2011-09-14 DIAGNOSIS — R079 Chest pain, unspecified: Secondary | ICD-10-CM

## 2011-09-14 NOTE — Telephone Encounter (Signed)
Left message for patient to return call. Please tell her she has an appointment with [facility] on 09/26/11 at 2:30 with Dr [place].Melba Araki, Rodena Medin

## 2011-09-15 NOTE — Telephone Encounter (Signed)
Called patient and informed her of appointment with cardiology. She says she does not feel she needs to see a cardiologist and is worried this is going to add too much to her hospital bill. She has an upcoming appointment with Dr Edmonia James on the 21st of this month so I told her not to cancel that appointment until she discusses it with [place] at her next appointment.Delfino Friesen, Rodena Medin

## 2011-09-22 ENCOUNTER — Ambulatory Visit (INDEPENDENT_AMBULATORY_CARE_PROVIDER_SITE_OTHER): Payer: 59 | Admitting: Family Medicine

## 2011-09-22 ENCOUNTER — Other Ambulatory Visit (HOSPITAL_COMMUNITY)
Admission: RE | Admit: 2011-09-22 | Discharge: 2011-09-22 | Disposition: A | Discharge status: Home / Self Care | Payer: 59 | Source: Ambulatory Visit | Attending: Family Medicine | Admitting: Family Medicine

## 2011-09-22 ENCOUNTER — Encounter: Payer: Self-pay | Admitting: Family Medicine

## 2011-09-22 VITALS — BP 129/77 | HR 62 | Temp 98.1°F | Ht 67.0 in | Wt 200.0 lb

## 2011-09-22 DIAGNOSIS — R0789 Other chest pain: Secondary | ICD-10-CM

## 2011-09-22 DIAGNOSIS — I10 Essential (primary) hypertension: Secondary | ICD-10-CM

## 2011-09-22 DIAGNOSIS — Z0000 Encounter for general adult medical examination without abnormal findings: Secondary | ICD-10-CM

## 2011-09-22 DIAGNOSIS — E663 Overweight: Secondary | ICD-10-CM

## 2011-09-22 DIAGNOSIS — Z01419 Encounter for gynecological examination (general) (routine) without abnormal findings: Secondary | ICD-10-CM | POA: Insufficient documentation

## 2011-09-22 DIAGNOSIS — Z124 Encounter for screening for malignant neoplasm of cervix: Secondary | ICD-10-CM

## 2011-09-22 NOTE — Progress Notes (Signed)
  Subjective:    Patient ID: Denise Reese, female    DOB: 02-19-1966, 46 y.o.   MRN: [id number]  HPI Patient here for routine physical:  PMH, Surgical history, Family History, allergies, medications and social history all updated under appropriate tabs.    Health maintenance: Patient had all lab work done at recent hospitalization-no needed at this time.-LDL was 74. Mammogram-handout given-patient had a mammogram 2 years ago due this year. Patient unsure of last tetanus vaccine-will check records and let us know  Weight management: Patient is exercising 3-6 times per week possibly 30 minutes at a time. Walks, uses treadmill, or uses [employer]. Is eating 3 fruits and vegetables per day.a chest pain. No shortness of breath.  Blood pressure followup: Concern for high blood pressure while in hospital. Blood pressure today 129/77. Also within normal limits at last appointment. Not any medication. No headaches. No dizziness. No vision changes.  Chest pain followup: Patient went to treadmill stress test with Dr. Jennette Kettle.  Some nonspecific ST changes identified during the end portion of the treadmill stress test. It was recommended that patient be seen by cardiology for further workup. Patient states that she has not had any further chest pain. Chest pain when it did occur was easily resolvable with slow breathing and removing herself from anxiety provoking situations. Patient states she does not want to be referred to cardiology at this time. Do to cost and the fact that she will have had a pocket and because her symptoms are improved.  Smoking status reviewed.   Review of Systems As per above    Objective:   Physical Exam  Constitutional: She appears well-developed and well-nourished.  HENT:  Head: Normocephalic and atraumatic.  Eyes: Pupils are equal, round, and reactive to light.  Neck: Normal range of motion. No thyromegaly present.  Cardiovascular: Normal rate, regular rhythm and normal  heart sounds.   No murmur heard. Pulmonary/Chest: Effort normal and breath sounds normal. No respiratory distress. She has no wheezes. She has no rales.  Abdominal: Soft. She exhibits no distension. There is no tenderness.  Genitourinary: Vagina normal and uterus normal. No breast swelling (no lumps or nodules bilateral), tenderness, discharge or bleeding. There is no rash, tenderness, lesion or injury on the right labia. There is no rash, tenderness, lesion or injury on the left labia. Cervix exhibits no motion tenderness, no discharge and no friability. Right adnexum displays no mass, no tenderness and no fullness. Left adnexum displays no mass, no tenderness and no fullness.  Musculoskeletal: She exhibits no edema.  Neurological: She is alert.  Skin: No rash noted.  Psychiatric: She has a normal mood and affect.          Assessment & Plan:

## 2011-09-22 NOTE — Patient Instructions (Addendum)
Chest pain: Let me know when you would like to see cardiology. Will hold on consult for now per your request.   Health maintence: Up to date on labwork  See mammogram handout Check to see when your last tetanus was- we could give it to you here. -- you need this every 10 years   Weight management: Continue to walk.  Handout on plate method- see handout  Return in 1 year for physical or sooner if needed.

## 2011-09-24 DIAGNOSIS — Z0000 Encounter for general adult medical examination without abnormal findings: Secondary | ICD-10-CM | POA: Insufficient documentation

## 2011-09-24 DIAGNOSIS — E663 Overweight: Secondary | ICD-10-CM | POA: Insufficient documentation

## 2011-09-24 NOTE — Assessment & Plan Note (Signed)
Patient had all lab work done at recent hospitalization-no needed at this time.-LDL was 74. Mammogram-handout given-patient had a mammogram 2 years ago due this year. Patient unsure of last tetanus vaccine-will check records and let us know Pap performed today

## 2011-09-24 NOTE — Assessment & Plan Note (Signed)
bp 129/77- pt had transient elevation in bp while in hospital.  No longer a problem. Will continue to monitor- but will take hypertension diagnosis off of chart since pt no longer meets criteria.

## 2011-09-24 NOTE — Assessment & Plan Note (Signed)
Had exercise stress test- had some abnormality on exam and pt referred to cardiology.  Pt states that she can't afford referral at this time and since pain has resolved she would like to hold on referral.  I explained that this is our recommendation to r/o cardiac etiology.  She states that she understandings this but will refuse referral at this time.  Will let me know if she changes her mind.

## 2011-09-24 NOTE — Assessment & Plan Note (Signed)
Pt encouraged to continue to exercise daily- pt also encouraged to eat healthy diet- gave plate method handout to patient.

## 2011-09-26 ENCOUNTER — Institutional Professional Consult (permissible substitution): Payer: 59 | Admitting: Cardiology

## 2011-09-26 ENCOUNTER — Encounter: Payer: Self-pay | Admitting: Family Medicine

## 2012-01-19 ENCOUNTER — Encounter: Payer: Self-pay | Admitting: Emergency Medicine

## 2012-01-19 ENCOUNTER — Ambulatory Visit (INDEPENDENT_AMBULATORY_CARE_PROVIDER_SITE_OTHER): Payer: 59 | Admitting: Emergency Medicine

## 2012-01-19 VITALS — BP 121/85 | HR 60 | Ht 67.0 in | Wt 197.7 lb

## 2012-01-19 DIAGNOSIS — M2550 Pain in unspecified joint: Secondary | ICD-10-CM

## 2012-01-19 DIAGNOSIS — R0789 Other chest pain: Secondary | ICD-10-CM

## 2012-01-19 NOTE — Patient Instructions (Addendum)
It was good to see you today. I'm sorry your joints are hurting.  I think this is likely just some arthritis that is flaring up with the weather changes.  You can continue the Meloxicam if it helps.  You can also take tylenol, up to 6 extra-strength tablets a day. I will put in a referral to cardiology for your chest pains.  You should get a call in the next 1-2 weeks with that appointment information. Follow up with me in 2-4 weeks if your joints continue to bother you.

## 2012-01-19 NOTE — Assessment & Plan Note (Signed)
With continued symptoms of heaviness in the chest with dizziness and a stress test positive for ischemic changes, will refer to cardiology.  Discussed with Dr. McDiarmid.

## 2012-01-19 NOTE — Progress Notes (Signed)
  Subjective:    Patient ID: Denise Reese, female    DOB: October 11, 1965, 46 y.o.   MRN: [id number]  HPI Ameka Krigbaum is here for joint pain.  1. Joint pain: Reports that her elbows, mostly her right, have been bothering her for the last 2 months.  The pain is worse with weight bearing with the elbow flexed.  Denies numbness, tingling, weakness.  Also has had anterior knee pain for a long time that is a little worse recently.  She thinks it is related to the weather changing.  2. Chest pain: Admitted for chest pain this past April and had a exercise stress test in June.  The stress test showed some ST depression at the end of the test.  So far, she has declined a cardiology referral.  She has continued to have chest pains.  Some are sharp.  Other times she has chest heaviness with fatigue and dizziness.  No triggers that she can identify.  I have reviewed and updated the following as appropriate: allergies and current medications SHx: never smoker   Review of Systems See HPI    Objective:   Physical Exam BP 121/85  Pulse 60  Ht 5\' 7"  (1.702 m)  Wt 197 lb 11.2 oz (89.676 kg)  BMI 30.96 kg/m2  LMP 01/13/2012 Gen: alert, cooperative, NAD HEENT: AT/[place], sclera white, MMM CV: RRR, no murmurs Pulm: CTAB, no wheezes or rales Ext: no edema, 2+ radial and DP pulses MSK -Knees: no erythema or effusion, crepitus bilaterally, full AROM, minimal point tenderness around patella bilaterally -right elbow: no erythema or effusion, no crepitus, mild point tenderness over the olecranon, full AROM, 5/5 strength in biceps, triceps, wrist ext, wrist flex without pain      Assessment & Plan:

## 2012-01-19 NOTE — Assessment & Plan Note (Signed)
Several year history of bilateral knee pain, now with knee and elbow pain.  No warning signs for an inflammatory arthritis or nerve etiology.  Likely related to some early arthritis that is worse with the weather changes.  Discussed using meloxicam (given by Dr. Prince Rome) and tylenol for pain control.  If no improvement in the next 2-4 weeks, she will come back for further work up.

## 2012-01-22 ENCOUNTER — Telehealth: Payer: Self-pay | Admitting: *Deleted

## 2012-01-22 NOTE — Telephone Encounter (Signed)
Called pt. Scheduled appt with Dr.[facility] 02/20/12 at 9:45 am.  Pt cancelled her previous appt with [place] 09/26/11. Advised pt, that NS cost $50 and in case she has to cancel appt, needs 48 hrs notice. Pt verbalized understanding.  Lorenda Hatchet, Renato Battles

## 2012-02-07 ENCOUNTER — Telehealth: Payer: Self-pay | Admitting: Emergency Medicine

## 2012-02-07 MED ORDER — NITROGLYCERIN 0.4 MG SL SUBL: 0.4000 mg | SUBLINGUAL_TABLET | SUBLINGUAL | Status: DC | PRN

## 2012-02-07 NOTE — Telephone Encounter (Signed)
Mother notified and she is in agreement with this plan.

## 2012-02-07 NOTE — Telephone Encounter (Signed)
Pt got a sharp pain in head and her arms got tingly yesterday - she is a little better today - lightheaded - has a dull pain in chest  Has an appt w/ cards on 11/19

## 2012-02-07 NOTE — Telephone Encounter (Signed)
Patient reports symptoms yesterday . Was able to work yesterday and went home to bed after work and this AM feels better today. Rode exercise bike for 30 minutes this AM.  Denise Reese had a sharp pain behind left eye.  Felt lightheaded like she was drunk. Dull pain in chest similar to what she has had in past. She has appointment with [facility] 11/19. Will forward message to Dr. Elwyn Reach.

## 2012-02-07 NOTE — Telephone Encounter (Signed)
Discussed with Dr. Gwendolyn Grant.  Overall, felt to be low risk for cardiac cause of pain.  However, will prescribe nitroglycerin that she can take for the chest pain (take 1 sublingual tab every 5 minutes until pain resolved; no more than 3 tablets per episode).  If the dull chest pain is lasting longer, does not respond to nitroglycerin, or consistently occuring after brief exercise, she should be seen.  Otherwise, she is okay to wait for the cardiology appt on 11/19.

## 2012-02-19 ENCOUNTER — Ambulatory Visit (HOSPITAL_COMMUNITY)
Admission: RE | Admit: 2012-02-19 | Discharge: 2012-02-19 | Disposition: A | Discharge status: Home / Self Care | Payer: 59 | Source: Ambulatory Visit | Attending: Emergency Medicine | Admitting: Emergency Medicine

## 2012-02-19 ENCOUNTER — Encounter: Payer: Self-pay | Admitting: Family Medicine

## 2012-02-19 ENCOUNTER — Ambulatory Visit (INDEPENDENT_AMBULATORY_CARE_PROVIDER_SITE_OTHER): Payer: 59 | Admitting: Family Medicine

## 2012-02-19 VITALS — BP 125/84 | HR 60 | Temp 98.9°F | Ht 67.0 in | Wt 198.0 lb

## 2012-02-19 DIAGNOSIS — R079 Chest pain, unspecified: Secondary | ICD-10-CM

## 2012-02-19 DIAGNOSIS — I440 Atrioventricular block, first degree: Secondary | ICD-10-CM | POA: Insufficient documentation

## 2012-02-19 DIAGNOSIS — I4460 Unspecified fascicular block: Secondary | ICD-10-CM | POA: Insufficient documentation

## 2012-02-19 DIAGNOSIS — R0789 Other chest pain: Secondary | ICD-10-CM

## 2012-02-19 NOTE — Patient Instructions (Signed)
I am sorry you are still having chest discomfort.  Your EKG looks the same as before.  Please keep you appointment with Dr. Elease Hashimoto tomorrow.   If you have sudden, severe chest pain, shortness of breath, or other concerning symptoms call the office or call [phone].

## 2012-02-19 NOTE — Progress Notes (Signed)
  Subjective:    Patient ID: Denise Reese, female    DOB: 02-04-1966, 46 y.o.   MRN: [id number]  HPI  Denise Reese comes in with chest heaviness.  She was admitted to the hospital and had an ACS rule out last April that was negative.  She does not have HTN, HLD, DM.  She had a treadmill stress test in June that had some non-specific ST changes, but was non-diagnostic.  She initially declined a Cardiology referral, but the chest discomfort continued, and she actually has an appointment to see Dr. Elease Reese tomorrow at [facility].  She says it has been more frequent the past several days, and she was not sure if she should wait until tomorrow to be seen by a doctor.  She has also had some neck pain and nausea.  She currently endorses some chest heaviness. She rides an exercise bike for 30 minutes daily and denies that this causes pain.   Past Medical History  Diagnosis Date  . Heart murmur     as a child   Family History  Problem Relation Age of Onset  . Alcohol abuse Father   . Osteoporosis Paternal Grandmother   - No family history of Cardiac disease.   History   Social History  . Marital Status: Married    Spouse Name: N/A    Number of Children: N/A  . Years of Education: N/A   Occupational History  . Not on file.   Social History Main Topics  . Smoking status: Never Smoker   . Smokeless tobacco: Not on file  . Alcohol Use: No  . Drug Use: No  . Sexually Active: Yes    Birth Control/ Protection: Surgical     Comment: married   Other Topics Concern  . 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 Living at home, lives with husband- married 7 years ago.    Review of Systems Pertinent items in HPI    Objective:   Physical Exam BP 125/84  Pulse 60  Temp 98.9 F (37.2 C) (Oral)  Ht 5\' 7"  (1.702 m)  Wt 198 lb (89.812 kg)  BMI 31.01 kg/m2 General appearance: alert, cooperative and no distress Neck: no carotid  bruit, no JVD and supple, symmetrical, trachea midline Lungs: clear to auscultation bilaterally Heart: regular rate and rhythm, S1, S2 normal, no murmur, click, rub or gallop Extremities: extremities normal, atraumatic, no cyanosis or edema Pulses: 2+ and symmetric  ECG:  Rate: 62 Rhythm: Sinus with 1st degree AV block Axis: Normal Intervals: Normal ST: No T-wave abnormalities or indication of ischemia.      Assessment & Plan:

## 2012-02-19 NOTE — Assessment & Plan Note (Signed)
This has been going on for >6 months, ECG is unchanged from previous.  She currently describes heaviness, and denies exertional symptoms.  Personal and Family History negative.  Feel that patient's chest pain may or may not be truly cardiac, and that she may benefit from further work up by cardiology.  Will have her go home and see cardiology tomorrow. Red flags to call [phone] tonight reviewed.

## 2012-02-20 ENCOUNTER — Encounter: Payer: Self-pay | Admitting: Cardiovascular Disease

## 2012-02-20 ENCOUNTER — Ambulatory Visit (INDEPENDENT_AMBULATORY_CARE_PROVIDER_SITE_OTHER): Payer: 59 | Admitting: Cardiovascular Disease

## 2012-02-20 VITALS — BP 105/71 | HR 61 | Ht 67.0 in | Wt 198.0 lb

## 2012-02-20 DIAGNOSIS — R0789 Other chest pain: Secondary | ICD-10-CM

## 2012-02-20 NOTE — Patient Instructions (Addendum)
Your physician recommends that you schedule a follow-up appointment in: as needed basis  Your physician has requested that you have en exercise stress myoview.  2 day study  Please follow instruction sheet, as given.

## 2012-02-20 NOTE — Assessment & Plan Note (Signed)
Denise Reese continues to have episodes of chest pain. She has stress test this summer which revealed that she had fairly good exercise capacity. She had some very mild nonspecific ST changes but they were not diagnostic and the test was negative.  She's continued to have episodes of chest pain. She was seen by her medical doctor yesterday.  Her EKG is unremarkable.  At this point I think it we should do a stress Myoview study. She's had continued episodes of chest pain despite having what appears to be a negative exercise treadmill test. We will see her back on an as-needed basis assuming that the stress test is negative. If it is abnormal, we will see her back and arrange for further testing.

## 2012-02-20 NOTE — Progress Notes (Signed)
    Denise Reese Date of Birth  Aug 12, 1965       [facility] Office [address]  [address] [place], [place]  [address]   [place], [place]  [address] [phone]     [phone]   Fax  [phone]    Fax [phone]  Problem List: 1. Chest pain  History of Present Illness:  Denise Reese is a 46 yo with 6+ months of chest heaviness.  She had a GXT in June that was negative.  These pains are described as a heaviness.  They occur at work ( injection molding).  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  The pains do not occur with riding her stationary bike.    She also describes some dizziness and lightheadedness on occasion.  Current Outpatient Prescriptions on File Prior to Visit  Medication Sig Dispense Refill  . dextromethorphan-guaiFENesin (MUCINEX DM) 30-600 MG per 12 hr tablet Take 1 tablet by mouth every 12 (twelve) hours.      . Multiple Vitamin (MULITIVITAMIN WITH MINERALS) TABS Take 1 tablet by mouth daily.      . nitroGLYCERIN (NITROSTAT) 0.4 MG SL tablet Place 1 tablet (0.4 mg total) under the tongue every 5 (five) minutes as needed for chest pain.  50 tablet  3  . vitamin B-12 (CYANOCOBALAMIN) 1000 MCG tablet Take 1,000 mcg by mouth daily.        Allergies  Allergen Reactions  . Sulfa Antibiotics Shortness Of Breath and Swelling  . Codeine Itching    Past Medical History  Diagnosis Date  . Heart murmur     as a child    Past Surgical History  Procedure Date  . Tonsillectomy   . Tubal ligation   . Tubal ligation   . Forearm surgery     plates inserted then removed later    History  Smoking status  . Never Smoker   Smokeless tobacco  . Not on file    History  Alcohol Use No    Family History  Problem Relation Age of Onset  . Alcohol abuse Father   . Osteoporosis Paternal Grandmother     Reviw of Systems:  Reviewed in the  HPI.  All other systems are negative.  Physical Exam: Blood pressure 105/71, pulse 61, height 5\' 7"  (1.702 m), weight 198 lb (89.812 kg). General: Well developed, well nourished, in no acute distress.  Head: Normocephalic, atraumatic, sclera non-icteric, mucus membranes are moist,   Neck: Supple. Carotids are 2 + without bruits. No JVD  Lungs: Clear bilaterally to auscultation.  Heart: regular rate.  normal  S1 S2. No murmurs, gallops or rubs.  Abdomen: Soft, non-tender, non-distended with normal bowel sounds. No hepatomegaly. No rebound/guarding. No masses.  Msk:  Strength and tone are normal  Extremities: No clubbing or cyanosis. No edema.  Distal pedal pulses are 2+ and equal bilaterally.  Neuro: Alert and oriented X 3. Moves all extremities spontaneously.  Psych:  Responds to questions appropriately with a normal affect.  ECG: Nov. 19, 2013 - NSR at 62, normal  ECG  Assessment / Plan:

## 2012-02-26 ENCOUNTER — Ambulatory Visit (HOSPITAL_COMMUNITY): Payer: 59 | Attending: Cardiovascular Disease | Admitting: Radiology

## 2012-02-26 VITALS — Ht 67.0 in | Wt 196.0 lb

## 2012-02-26 DIAGNOSIS — R0789 Other chest pain: Secondary | ICD-10-CM

## 2012-02-26 DIAGNOSIS — R079 Chest pain, unspecified: Secondary | ICD-10-CM

## 2012-02-26 DIAGNOSIS — R0609 Other forms of dyspnea: Secondary | ICD-10-CM | POA: Insufficient documentation

## 2012-02-26 DIAGNOSIS — R0989 Other specified symptoms and signs involving the circulatory and respiratory systems: Secondary | ICD-10-CM | POA: Insufficient documentation

## 2012-02-26 DIAGNOSIS — I10 Essential (primary) hypertension: Secondary | ICD-10-CM | POA: Insufficient documentation

## 2012-02-26 DIAGNOSIS — R0602 Shortness of breath: Secondary | ICD-10-CM

## 2012-02-26 DIAGNOSIS — R42 Dizziness and giddiness: Secondary | ICD-10-CM | POA: Insufficient documentation

## 2012-02-26 MED ORDER — TECHNETIUM TC 99M SESTAMIBI GENERIC - CARDIOLITE
32.0000 | Freq: Once | INTRAVENOUS | Status: AC | PRN
  Administered 2012-02-26: 32 via INTRAVENOUS

## 2012-02-26 NOTE — Progress Notes (Signed)
[facility] SITE 3 NUCLEAR MED [address] [id number] [place] [address] [phone]  Cardiology Nuclear Med Study  Denise Reese is a 46 y.o. female     MRN : [id number]     DOB: Jun 01, 1965  Procedure Date: 02/26/2012  Nuclear Med Background Indication for Stress Test:  Evaluation for Ischemia History:  No prior known history of CAD, and 09-2011 GXT: Normal Cardiac Risk Factors: Hypertension  Symptoms: Chest Pain and Chest Pressure with/without exertion (last occurrence 2 days ago while walking hill), After exercise, the patient stated that she had baseline chest pain 1/10 prior to exercise.  Dizziness, DOE, Fatigue, Fatigue with Exertion, Light-Headedness, Nausea, Near Syncope and SOB   Nuclear Pre-Procedure Caffeine/Decaff Intake:  None > 12 hrs NPO After: 7:00am   Lungs:  clear O2 Sat: 99% on room air. IV 0.9% NS with Angio Cath:  22g  IV Site: R Hand x 1, tolerated well IV Started by:  Irean Hong, RN  Chest Size (in):  38 Cup Size: DD  Height: 5\' 7"  (1.702 m)  Weight:  196 lb (88.905 kg)  BMI:  Body mass index is 30.70 kg/(m^2). Tech Comments:  n/a    Nuclear Med Study 1 or 2 day study: 2 day  Stress Test Type:  Stress  Reading MD: Loistine Chance Nahser,m.D. Order Authorizing Provider:  Kristeen Miss, MD  Resting Radionuclide: Technetium 40m Sestamibi  Resting Radionuclide Dose: 33.0 mCi   On     02-28-12  Stress Radionuclide:  Technetium 66m Sestamibi  Stress Radionuclide Dose: 32.0 mCi     On      02-26-12          Stress Protocol Rest HR: 57 Stress HR: 153  Rest BP: 111/73 Stress BP: 135/58  Exercise Time (min): 8:31 METS: 10.1   Predicted Max HR: 175 bpm % Max HR: 87.43 bpm Rate Pressure Product: 74259   Dose of Adenosine (mg):  n/a Dose of Lexiscan: n/a mg  Dose of Atropine (mg): n/a Dose of Dobutamine: n/a mcg/kg/min (at max HR)  Stress Test Technologist: Irean Hong, RN  Nuclear Technologist:  Doyne Keel, CNMT     Rest  Procedure:  Myocardial perfusion imaging was performed at rest 45 minutes following the intravenous administration of Technetium 25m Sestamibi. Rest ECG: Sinus Bradycardia with 1st degree AVB  Stress Procedure:  The patient performed treadmill exercise using a Bruce  Protocol for 8 minutes and 31 seconds, RPE=15. The patient stopped due to Chest Heaviness 4-5/10, and DOE.  There were no significant ST-T wave changes. There was a slight decrease BP 124/58, immediately post exercise. Technetium 72m Sestamibi was injected at peak exercise and myocardial perfusion imaging was performed after a brief delay. Stress ECG: No significant change from baseline ECG  QPS Raw Data Images:  There is a breast shadow that accounts for the anterior attenuation. Stress Images:  There is mild attenuation of the mid and distal anterior wall with normal uptake in the other regions.    Rest Images:  There is mild attenuation of the mid and distal anterior wall with normal uptake in the other regions.  Subtraction (SDS):  No evidence of ischemia. Transient Ischemic Dilatation (Normal <1.22):  1.21 Lung/Heart Ratio (Normal <0.45):  0.34  Quantitative Gated Spect Images QGS EDV:  87 ml QGS ESV:  23 ml  Impression Exercise Capacity:  Good exercise capacity. BP Response:  Normal blood pressure response. Clinical Symptoms:  No significant symptoms noted. ECG Impression:  No significant  ST segment change suggestive of ischemia. Comparison with Prior Nuclear Study: No previous nuclear study performed  Overall Impression:  Normal stress nuclear study.  No evidence of ischemia.  Normal LV function LV Ejection Fraction: 74%.  LV Wall Motion:  NL LV Function; NL Wall Motion.    Vesta Mixer, Montez Hageman., MD, [facility] 02/28/2012, 5:28 PM Office - [phone] Pager [phone]

## 2012-02-28 ENCOUNTER — Ambulatory Visit (HOSPITAL_COMMUNITY): Payer: 59 | Attending: Cardiovascular Disease | Admitting: Radiology

## 2012-02-28 DIAGNOSIS — R0989 Other specified symptoms and signs involving the circulatory and respiratory systems: Secondary | ICD-10-CM

## 2012-02-28 MED ORDER — TECHNETIUM TC 99M SESTAMIBI GENERIC - CARDIOLITE
33.0000 | Freq: Once | INTRAVENOUS | Status: AC | PRN
  Administered 2012-02-28: 33 via INTRAVENOUS

## 2012-03-05 ENCOUNTER — Telehealth: Payer: Self-pay | Admitting: Cardiovascular Disease

## 2012-03-05 NOTE — Telephone Encounter (Signed)
Normal results given and forwarded to her pcp.

## 2012-03-05 NOTE — Telephone Encounter (Signed)
New Problem:    Patient called in because her Stress Test results posted on her mychart but she was unable to view them and would like to know what the results were.  Please call back.

## 2012-03-08 ENCOUNTER — Other Ambulatory Visit (HOSPITAL_COMMUNITY): Payer: Self-pay | Admitting: Family Medicine

## 2012-03-08 DIAGNOSIS — R0602 Shortness of breath: Secondary | ICD-10-CM

## 2012-03-14 ENCOUNTER — Ambulatory Visit (HOSPITAL_COMMUNITY)
Admission: RE | Admit: 2012-03-14 | Discharge: 2012-03-14 | Disposition: A | Discharge status: Home / Self Care | Payer: 59 | Source: Ambulatory Visit | Attending: Family Medicine | Admitting: Family Medicine

## 2012-03-14 DIAGNOSIS — R0602 Shortness of breath: Secondary | ICD-10-CM | POA: Insufficient documentation

## 2012-03-14 MED ORDER — ALBUTEROL SULFATE (5 MG/ML) 0.5% IN NEBU
2.5000 mg | INHALATION_SOLUTION | Freq: Once | RESPIRATORY_TRACT | Status: AC
  Administered 2012-03-14: 2.5 mg via RESPIRATORY_TRACT

## 2012-05-18 ENCOUNTER — Other Ambulatory Visit: Payer: Self-pay

## 2012-08-13 ENCOUNTER — Telehealth: Payer: Self-pay | Admitting: Cardiovascular Disease

## 2012-08-13 NOTE — Telephone Encounter (Signed)
New Problem:    Patient called in returning a call.  Patient did not know the name of the person calling, only that it was a lady and she called at about 5:34pm last night.  Please call back.

## 2012-08-13 NOTE — Telephone Encounter (Signed)
msg left/ Denise Reese was old, test was normal, apologized for recalling the results.

## 2012-08-19 ENCOUNTER — Encounter (HOSPITAL_COMMUNITY): Payer: Self-pay | Admitting: Emergency Medicine

## 2012-08-19 ENCOUNTER — Emergency Department (INDEPENDENT_AMBULATORY_CARE_PROVIDER_SITE_OTHER)
Admission: EM | Admit: 2012-08-19 | Discharge: 2012-08-19 | Disposition: A | Discharge status: Home / Self Care | Payer: 59 | Source: Home / Self Care | Attending: Emergency Medicine | Admitting: Emergency Medicine

## 2012-08-19 DIAGNOSIS — R05 Cough: Secondary | ICD-10-CM

## 2012-08-19 DIAGNOSIS — R059 Cough, unspecified: Secondary | ICD-10-CM

## 2012-08-19 DIAGNOSIS — J329 Chronic sinusitis, unspecified: Secondary | ICD-10-CM

## 2012-08-19 MED ORDER — AMOXICILLIN 500 MG PO CAPS: 500.0000 mg | ORAL_CAPSULE | Freq: Three times a day (TID) | ORAL | Status: AC

## 2012-08-19 MED ORDER — FEXOFENADINE-PSEUDOEPHED ER 60-120 MG PO TB12: 1.0000 | ORAL_TABLET | Freq: Two times a day (BID) | ORAL | Status: AC

## 2012-08-19 MED ORDER — PREDNISONE 20 MG PO TABS: 40.0000 mg | ORAL_TABLET | Freq: Every day | ORAL | Status: AC

## 2012-08-19 NOTE — ED Notes (Addendum)
Pt c/o sx including runny nose, congestion, sneezing, and productive cough with greenish phlegm. Also feels like ears are stuffed up. Chest hurts from coughing. Has a headache and sinus pressure. Has been taking Tylenol severe cold with no relief of symptoms. Patient is alert and oriented.

## 2012-08-19 NOTE — ED Provider Notes (Signed)
As a dull and History     CSN: [id number]  Arrival date & time 08/19/12  1636   First MD Initiated Contact with Patient 08/19/12 1651      Chief Complaint  Patient presents with  . URI    (Consider location/radiation/quality/duration/timing/severity/associated sxs/prior treatment) HPI Comments: Patient presents urgent care describing sinus pressure cough with phlegm for about 5-7 days and is worsening she has been taken over-the-counter decongestants no partial relief. Denies any fever shortness of breath he was also having a sore throat.  Patient is a 47 y.o. female presenting with URI. The history is provided by the patient.  URI Presenting symptoms: congestion, cough, ear pain, facial pain, rhinorrhea and sore throat   Presenting symptoms: no fever   Severity:  Moderate Timing:  Constant Chronicity:  New Worsened by:  Nothing tried Ineffective treatments:  None tried Associated symptoms: sinus pain   Associated symptoms: no arthralgias, no headaches, no neck pain, no sneezing, no swollen glands and no wheezing     Past Medical History  Diagnosis Date  . Heart murmur     as a child    Past Surgical History  Procedure Laterality Date  . Tonsillectomy    . Tubal ligation    . Tubal ligation    . Forearm surgery      plates inserted then removed later    Family History  Problem Relation Age of Onset  . Alcohol abuse Father   . Osteoporosis Paternal Grandmother     History  Substance Use Topics  . Smoking status: Never Smoker   . Smokeless tobacco: Not on file  . Alcohol Use: No    OB History   Grav Para Term Preterm Abortions TAB SAB Ect Mult Living                  Review of Systems  Constitutional: Negative for fever, activity change and appetite change.  HENT: Positive for ear pain, congestion, sore throat and rhinorrhea. Negative for sneezing, neck pain and neck stiffness.   Respiratory: Positive for cough. Negative for chest tightness, shortness  of breath and wheezing.   Gastrointestinal: Negative for nausea, abdominal pain and diarrhea.  Musculoskeletal: Negative for arthralgias.  Skin: Negative for rash.  Neurological: Negative for headaches.    Allergies  Sulfa antibiotics and Codeine  Home Medications   Current Outpatient Rx  Name  Route  Sig  Dispense  Refill  . sertraline (ZOLOFT) 50 MG tablet   Oral   Take 50 mg by mouth daily.         Marland Kitchen amoxicillin (AMOXIL) 500 MG capsule   Oral   Take 1 capsule (500 mg total) by mouth 3 (three) times daily.   30 capsule   0   . dextromethorphan-guaiFENesin (MUCINEX DM) 30-600 MG per 12 hr tablet   Oral   Take 1 tablet by mouth every 12 (twelve) hours.         . fexofenadine-pseudoephedrine (ALLEGRA-D) 60-120 MG per tablet   Oral   Take 1 tablet by mouth 2 (two) times daily.   30 tablet   0   . meloxicam (MOBIC) 7.5 MG tablet   Oral   Take 7.5 mg by mouth daily.         . Multiple Vitamin (MULITIVITAMIN WITH MINERALS) TABS   Oral   Take 1 tablet by mouth daily.         . nitroGLYCERIN (NITROSTAT) 0.4 MG SL tablet   Sublingual  Place 1 tablet (0.4 mg total) under the tongue every 5 (five) minutes as needed for chest pain.   50 tablet   3   . predniSONE (DELTASONE) 20 MG tablet   Oral   Take 2 tablets (40 mg total) by mouth daily. 2 tablets daily for 5 days   6 tablet   0   . vitamin B-12 (CYANOCOBALAMIN) 1000 MCG tablet   Oral   Take 1,000 mcg by mouth daily.           BP 126/75  Pulse 60  Temp(Src) 97.9 F (36.6 C) (Oral)  Resp 18  SpO2 99%  LMP 08/02/2012  Physical Exam  Nursing note and vitals reviewed. Constitutional: Vital signs are normal. She appears well-developed and well-nourished.  Non-toxic appearance. She does not have a sickly appearance. She does not appear ill. No distress.  HENT:  Head: Normocephalic.  Nose: Nose normal.  Mouth/Throat: Uvula is midline. No oropharyngeal exudate, posterior oropharyngeal erythema or  tonsillar abscesses.  Eyes: Conjunctivae are normal. No scleral icterus.  Neck: Neck supple. No JVD present.  Cardiovascular: Normal rate.   No murmur heard. Pulmonary/Chest: Effort normal.  Abdominal: Soft.  Lymphadenopathy:    She has no cervical adenopathy.  Neurological: She is alert.  Skin: No rash noted. No erythema.    ED Course  Procedures (including critical care time)  Labs Reviewed - No data to display No results found.   1. Sinusitis   2. Cough       MDM  Uncomplicated sinusitis. Patient looks comfortable afebrile no respiratory distress        Jimmie Molly, MD 08/19/12 [id number]

## 2012-10-24 ENCOUNTER — Encounter: Payer: Self-pay | Admitting: Obstetrics and Gynecology

## 2012-10-24 ENCOUNTER — Ambulatory Visit (INDEPENDENT_AMBULATORY_CARE_PROVIDER_SITE_OTHER): Payer: 59 | Admitting: Obstetrics and Gynecology

## 2012-10-24 VITALS — BP 120/82 | HR 64 | Temp 98.5°F | Resp 24 | Ht 66.0 in | Wt 211.0 lb

## 2012-10-24 DIAGNOSIS — Z01419 Encounter for gynecological examination (general) (routine) without abnormal findings: Secondary | ICD-10-CM

## 2012-10-24 NOTE — Progress Notes (Signed)
Patient ID: Denise Reese, female   DOB: 03-06-1966, 47 y.o.   MRN: [id number] 47 y.o.   Married    Caucasian   female   G2P1011   here for annual exam.    Multiple issues.  Some urinary leakage.  Patient reporting fatigue and difficulty sleeping. History of swelling, chest pain and heaviness. Had a cardiac and respiratory work up which were negative.  Negative stress test and myoview, negative cardiac enzymes, pulmonary function studies.  Heart rate is a little low at 60 bpm. Patient is not taking nitroglycerine.  Sees Corky Mull for primary care. Normal cholesterol.   Patient treated with Zoloft by PCP to treat anxiety.  Now on 100 mg.  Patient concerned about premenopause.  Still has menses. Monthly menses until recently. Has bleeding continuously for three weeks in May.   None since then.   Takes an over the counter medication for lower extremity edema of feet and hands.  Saw PCP at that time.  No evaluation or treatment for bleeding.    Is having hot flashes that come and go. Some night sweats.  Uses fan and cools the temperature of the house.  Takes Amberen every day (dietary supplement without any soy or Black cohosh.) Has tried melatonin to sleep better.    Took combined oral contraceptive in the past.  Had some headaches so she took a lower dose and then did well.  Patient's last menstrual period was 10/10/2012.          Sexually active: yes  The current method of family planning is tubal ligation.    Exercising: none at this time Last mammogram:  2 years ago, unsure location Last pap smear: 09/22/11, normal, no HPV testing History of abnormal pap: none Smoking: none Alcohol: none Last colonoscopy: never Last Bone Density:  ? 1992 Last tetanus shot: 2013 Last cholesterol check: 2014   Hgb:  declined       Urine:  declined   Family History  Problem Relation Age of Onset  . Alcohol abuse Father   . Osteoporosis Paternal Grandmother     Patient Active  Problem List   Diagnosis Date Noted  . Joint pain 01/19/2012  . Health maintenance examination 09/24/2011  . Overweight 09/24/2011  . Atypical chest pain 08/05/2011  . Elevated TSH 08/05/2011  . Hypertension 08/05/2011    Past Medical History  Diagnosis Date  . Heart murmur     as a child    Past Surgical History  Procedure Laterality Date  . Tonsillectomy    . Tubal ligation    . Tubal ligation    . Forearm surgery      plates inserted then removed later    Allergies: Sulfa antibiotics and Codeine  Current Outpatient Prescriptions  Medication Sig Dispense Refill  . dextromethorphan-guaiFENesin (MUCINEX DM) 30-600 MG per 12 hr tablet Take 1 tablet by mouth every 12 (twelve) hours as needed.       . nitroGLYCERIN (NITROSTAT) 0.4 MG SL tablet Place 1 tablet (0.4 mg total) under the tongue every 5 (five) minutes as needed for chest pain.  50 tablet  3  . OVER THE COUNTER MEDICATION AMBEREN.  Menopause symptom relief.  Take 2 tablets, one white and one orange once daily.      . sertraline (ZOLOFT) 100 MG tablet Take 100 mg by mouth daily.      . Multiple Vitamin (MULITIVITAMIN WITH MINERALS) TABS Take 1 tablet by mouth daily.  No current facility-administered medications for this visit.    ROS: Pertinent items are noted in HPI.  Social Hx:    Exam:    BP 120/82  Pulse 64  Temp(Src) 98.5 F (36.9 C) (Oral)  Resp 24  Ht 5\' 6"  (1.676 m)  Wt 211 lb (95.709 kg)  BMI 34.07 kg/m2  LMP 10/10/2012   Wt Readings from Last 3 Encounters:  10/24/12 211 lb (95.709 kg)  02/26/12 196 lb (88.905 kg)  02/20/12 198 lb (89.812 kg)     Ht Readings from Last 3 Encounters:  10/24/12 5\' 6"  (1.676 m)  02/26/12 5\' 7"  (1.702 m)  02/20/12 5\' 7"  (1.702 m)    General appearance: alert, cooperative and appears stated age Head: Normocephalic, without obvious abnormality, atraumatic Neck: no adenopathy, supple, symmetrical, trachea midline and thyroid not enlarged, symmetric, no  tenderness/mass/nodules Lungs: clear to auscultation bilaterally Breasts: Inspection negative, No nipple retraction or dimpling, No nipple discharge or bleeding, No axillary or supraclavicular adenopathy, Normal to palpation without dominant masses Heart: regular rate and rhythm Abdomen: soft, non-tender; bowel sounds normal; no masses,  no organomegaly Extremities: extremities normal, atraumatic, no cyanosis or edema Skin: Skin color, texture, turgor normal. No rashes or lesions Lymph nodes: Cervical, supraclavicular, and axillary nodes normal. No abnormal inguinal nodes palpated Neurologic: Grossly normal   Pelvic: External genitalia:  no lesions              Urethra:  normal appearing urethra with no masses, tenderness or lesions              Bartholins and Skenes: normal                 Vagina: normal appearing vagina with normal color and discharge, no lesions              Cervix: normal appearance              Pap taken: yes        Bimanual Exam:  Uterus:  uterus is normal size, shape, consistency and nontender                                      Adnexa: normal adnexa in size, nontender and no masses                                      Rectovaginal: Confirms                                      Anus:  normal sphincter tone, no lesions  Assessment Normal gynecologic exam Perimenopausal symptoms Anxiety - under treatment with her PCp     [facility]  Patient will call to schedule an appointment pap smear and high risk HPV testing. I discussed hormonal treatment in perimenopause with ultra low dose birth control pills or progesterone only birth control pills. I have also suggested Estroven. Patient will return after she completes her mammogram to discuss her hormones further and another other issues. Patient sees her PCP regularly for all of her medical issues.  return annually or prn     An After Visit Summary was printed and given to the  patient.

## 2012-10-24 NOTE — Patient Instructions (Signed)
EXERCISE AND DIET:  We recommended that you start or continue a regular exercise program for good health. Regular exercise means any activity that makes your heart beat faster and makes you sweat.  We recommend exercising at least 30 minutes per day at least 3 days a week, preferably 4 or 5.  We also recommend a diet low in fat and sugar.  Inactivity, poor dietary choices and obesity can cause diabetes, heart attack, stroke, and kidney damage, among others.    ALCOHOL AND SMOKING:  Women should limit their alcohol intake to no more than 7 drinks/beers/glasses of wine (combined, not each!) per week. Moderation of alcohol intake to this level decreases your risk of breast cancer and liver damage. And of course, no recreational drugs are part of a healthy lifestyle.  And absolutely no smoking or even second hand smoke. Most people know smoking can cause heart and lung diseases, but did you know it also contributes to weakening of your bones? Aging of your skin?  Yellowing of your teeth and nails?  CALCIUM AND VITAMIN D:  Adequate intake of calcium and Vitamin D are recommended.  The recommendations for exact amounts of these supplements seem to change often, but generally speaking 600 mg of calcium (either carbonate or citrate) and 800 units of Vitamin D per day seems prudent. Certain women may benefit from higher intake of Vitamin D.  If you are among these women, your doctor will have told you during your visit.    PAP SMEARS:  Pap smears, to check for cervical cancer or precancers,  have traditionally been done yearly, although recent scientific advances have shown that most women can have pap smears less often.  However, every woman still should have a physical exam from her gynecologist every year. It will include a breast check, inspection of the vulva and vagina to check for abnormal growths or skin changes, a visual exam of the cervix, and then an exam to evaluate the size and shape of the uterus and  ovaries.  And after 46 years of age, a rectal exam is indicated to check for rectal cancers. We will also provide age appropriate advice regarding health maintenance, like when you should have certain vaccines, screening for sexually transmitted diseases, bone density testing, colonoscopy, mammograms, etc.   MAMMOGRAMS:  All women over 40 years old should have a yearly mammogram. Many facilities now offer a "3D" mammogram, which may cost around $50 extra out of pocket. If possible,  we recommend you accept the option to have the 3D mammogram performed.  It both reduces the number of women who will be called back for extra views which then turn out to be normal, and it is better than the routine mammogram at detecting truly abnormal areas.    COLONOSCOPY:  Colonoscopy to screen for colon cancer is recommended for all women at age 50.  We know, you hate the idea of the prep.  We agree, BUT, having colon cancer and not knowing it is worse!!  Colon cancer so often starts as a polyp that can be seen and removed at colonscopy, which can quite literally save your life!  And if your first colonoscopy is normal and you have no family history of colon cancer, most women don't have to have it again for 10 years.  Once every ten years, you can do something that may end up saving your life, right?  We will be happy to help you get it scheduled when you are ready.    Be sure to check your insurance coverage so you understand how much it will cost.  It may be covered as a preventative service at no cost, but you should check your particular policy.     Menopause and Herbal Products Menopause is the normal time of life when menstrual periods stop completely. Menopause is complete when you have missed 12 consecutive menstrual periods. It usually occurs between the ages of 33 to 51, with an average age of 9. Very rarely does a woman develop menopause before 46 years old. At menopause, your ovaries stop producing the female  hormones, estrogen and progesterone. This can cause undesirable symptoms and also affect your health. Sometimes the symptoms can occur 4 to 5 years before the menopause begins. There is no relationship between menopause and:  Oral contraceptives.  Number of children you had.  Race.  The age your menstrual periods started (menarche). Heavy smokers and very thin women may develop menopause earlier in life. Estrogen and progesterone hormone treatment is the usual method of treating menopausal symptoms. However, there are women who should not take hormone treatment. This is true of:   Women that have breast or uterine cancer.  Women who prefer not to take hormones because of certain side effects (abnormal uterine bleeding).  Women who are afraid that hormones may cause breast cancer.  Women who have a history of liver disease, heart disease, stroke, or blood clots. For these women, there are other medications that may help treat their menopausal symptoms. These medications are found in plants and botanical products. They can be found in the form of herbs, teas, oils, tinctures, and pills.  CAUSES:  The ovaries stop producing the female hormones estrogen and progesterone.  Other causes include:  Surgery to remove both ovaries.  The ovaries stop functioning for no know reason.  Tumors of the pituitary gland in the brain.  Medical disease that affects the ovaries and hormone production.  Radiation treatment to the abdomen or pelvis.  Chemotherapy that affects the ovaries. PHYTOESTROGENS: Phytoestrogens occur naturally in plants and plant products. They act like estrogen in the body. Herbal medications are made from these plants and botanical steroids. There are 3 types of phytoestrogens:  Isoflavones (genistein and daidzein) are found in soy, garbanzo beans, miso and tofu foods.  Ligins are found in the shell of seeds. They are used to make oils like flaxseed oil. The bacteria in  your intestine act on these foods to produce the estrogen-like hormones.  Coumestans are estrogen-like. Some of the foods they are found in include sunflower seeds and bean sprouts. CONDITIONS AND THEIR POSSIBLE HERBAL TREATMENT:  Hot flashes and night sweats.  Soy, black cohosh and evening primrose.  Irritability, insomnia, depression and memory problems.  Chasteberry, ginseng, and soy.  St. John's wort may be helpful for depression. However, there is a concern of it causing cataracts of the eye and may have bad effects on other medications. St. John's wort should not be taken for long time and without your caregiver's advice.  Loss of libido and vaginal and skin dryness.  Wild yam and soy.  Prevention of coronary heart disease and osteoporosis.  Soy and Isoflavones. Several studies have shown that some women benefit from herbal medications, but most of the studies have not consistently shown that these supplements are much better than placebo. Other forms of treatment to help women with menopausal symptoms include a balanced diet, rest, exercise, vitamin and calcium (with vitamin D) supplements, acupuncture, and group therapy  when necessary. THOSE WHO SHOULD NOT TAKE HERBAL MEDICATIONS INCLUDE:  Women who are planning on getting pregnant unless told by your caregiver.  Women who are breastfeeding unless told by your caregiver.  Women who are taking other prescription medications unless told by your caregiver.  Infants, children, and elderly women unless told by your caregiver. Different herbal medications have different and unmeasured amounts of the herbal ingredients. There are no regulations, quality control, and standardization of the ingredients in herbal medications. Therefore, the amount of the ingredient in the medication may vary from one herb, pill, tea, oil or tincture to another. Many herbal medications can cause serious problems and can even have poisonous effects if  taken too much or too long. If problems develop, the medication should be stopped and recorded by your caregiver. HOME CARE INSTRUCTIONS  Do not take or give children herbal medications without your caregiver's advice.  Let your caregiver know all the medications you are taking. This includes prescription, over-the-counter, eye drops, and creams.  Do not take herbal medications longer or more than recommended.  Tell your caregiver about any side effects from the medication. SEEK MEDICAL CARE IF:  You develop a fever of 102 F (38.9 C), or as directed by your caregiver.  You feel sick to your stomach (nauseous), vomit, or have diarrhea.  You develop a rash.  You develop abdominal pain.  You develop severe headaches.  You start to have vision problems.  You feel dizzy or faint.  You start to feel numbness in any part of your body.  You start shaking (have convulsions). Document Released: 09/06/2007 Document Revised: 03/06/2012 Document Reviewed: 04/05/2010 [facility] Patient Information 2014 [place], [place].  You can try a product called Estroven.

## 2012-10-28 LAB — IPS PAP TEST WITH HPV

## 2012-11-08 ENCOUNTER — Inpatient Hospital Stay: Admission: RE | Admit: 2012-11-08 | Payer: 59 | Source: Ambulatory Visit

## 2012-11-08 ENCOUNTER — Other Ambulatory Visit: Payer: Self-pay | Admitting: Obstetrics and Gynecology

## 2012-11-08 DIAGNOSIS — R928 Other abnormal and inconclusive findings on diagnostic imaging of breast: Secondary | ICD-10-CM

## 2012-11-27 ENCOUNTER — Ambulatory Visit
Admission: RE | Admit: 2012-11-27 | Discharge: 2012-11-27 | Disposition: A | Discharge status: Home / Self Care | Payer: 59 | Source: Ambulatory Visit | Attending: Obstetrics and Gynecology | Admitting: Obstetrics and Gynecology

## 2012-11-27 DIAGNOSIS — R928 Other abnormal and inconclusive findings on diagnostic imaging of breast: Secondary | ICD-10-CM

## 2012-12-11 ENCOUNTER — Encounter: Payer: Self-pay | Admitting: Obstetrics and Gynecology

## 2012-12-11 ENCOUNTER — Ambulatory Visit (INDEPENDENT_AMBULATORY_CARE_PROVIDER_SITE_OTHER): Payer: 59 | Admitting: Obstetrics and Gynecology

## 2012-12-11 VITALS — BP 110/70 | HR 80 | Ht 66.0 in | Wt 221.0 lb

## 2012-12-11 DIAGNOSIS — N951 Menopausal and female climacteric states: Secondary | ICD-10-CM

## 2012-12-11 NOTE — Patient Instructions (Addendum)

## 2012-12-11 NOTE — Progress Notes (Signed)
Patient ID: Denise Reese, female   DOB: [birth year], 47 y.o.   MRN: [id number] GYNECOLOGY PROBLEM VISIT  PCP: Gasper Lloyd, PA-C  Referring provider:   HPI: 47 y.o.   Married  Caucasian  female   G2P1011 with Patient's last menstrual period was 10/08/2012.   here for  Hot flashes and insomnia.  No energy to do work or exercise. Notes weight gain. Notes ankle swelling.  Is more forgetful.  Difficulty with multitasking. Has tried Estroven, black cohosh, valarian root, and melatonin.  Valarian root is the most helpful.   Had a normal mammogram with diagnostic on the right and ultrasound.   Had a complete cardiovascular and respiratory work up for chest pain - negative.  Patient is not taking Nitroglycerin.   Had headaches when took OCPS in the past.   GYNECOLOGIC HISTORY: Patient's last menstrual period was 10/08/2012. Sexually active:  yes Partner preference: female Contraception:  Tubal  Menopausal hormone therapy:  DES exposure:   no Blood transfusions:   no Sexually transmitted diseases:   denies GYN Procedures:  Tubal Mammogram:  [facility]-12 [facility]               Pap:   10-24-12 wnl:Neg HR HPV History of abnormal pap smear:     OB History   Grav Para Term Preterm Abortions TAB SAB Ect Mult Living   2 1 1  1 1    1          Family History  Problem Relation Age of Onset  . Alcohol abuse Father   . Osteoporosis Paternal Grandmother     Patient Active Problem List   Diagnosis Date Noted  . Joint pain 01/19/2012  . Health maintenance examination 09/24/2011  . Overweight 09/24/2011  . Atypical chest pain 08/05/2011  . Elevated TSH 08/05/2011  . Hypertension 08/05/2011    Past Medical History  Diagnosis Date  . Heart murmur     as a child    Past Surgical History  Procedure Laterality Date  . Tonsillectomy    . Tubal ligation    . Tubal ligation    . Forearm surgery      plates inserted then removed later    ALLERGIES: Sulfa antibiotics and  Codeine  Current Outpatient Prescriptions  Medication Sig Dispense Refill  . dextromethorphan-guaiFENesin (MUCINEX DM) 30-600 MG per 12 hr tablet Take 1 tablet by mouth every 12 (twelve) hours as needed.       . diclofenac sodium (VOLTAREN) 1 % GEL Apply 2 g topically daily.      . Multiple Vitamin (MULITIVITAMIN WITH MINERALS) TABS Take 1 tablet by mouth daily.      . nitroGLYCERIN (NITROSTAT) 0.4 MG SL tablet Place 1 tablet (0.4 mg total) under the tongue every 5 (five) minutes as needed for chest pain.  50 tablet  3  . OVER THE COUNTER MEDICATION AMBEREN.  Menopause symptom relief.  Take 2 tablets, one white and one orange once daily.       No current facility-administered medications for this visit.     ROS:  Pertinent items are noted in HPI.  PHYSICAL EXAMINATION:    BP 110/70  Pulse 80  Ht 5\' 6"  (1.676 m)  Wt 221 lb (100.245 kg)  BMI 35.69 kg/m2  LMP 10/08/2012   Wt Readings from Last 3 Encounters:  12/11/12 221 lb (100.245 kg)  10/24/12 211 lb (95.709 kg)  02/26/12 196 lb (88.905 kg)     Ht Readings from Last 3  Encounters:  12/11/12 5\' 6"  (1.676 m)  10/24/12 5\' 6"  (1.676 m)  02/26/12 5\' 7"  (1.702 m)    General appearance: alert, cooperative and appears stated age  ASSESSMENT  Menopausal symptoms. Recent normal diagnostic mammogram and ultrasound from a call back.  PLAN  Check FSH and estradiol> I discussed HRT versus ultra low dose OCPs.  I reviewed risks and benefits of each including risk of stroke, PE, DVT, breast cancer.  Watch for headaches.  Will determine Rx choice after labs back.  Return  In 3 months for a recheck.    An After Visit Summary was printed and given to the patient.

## 2012-12-12 ENCOUNTER — Other Ambulatory Visit: Payer: Self-pay | Admitting: Obstetrics and Gynecology

## 2012-12-12 LAB — FOLLICLE STIMULATING HORMONE: FSH: 29.8 m[IU]/mL

## 2012-12-12 LAB — ESTRADIOL: Estradiol: 26.3 pg/mL

## 2012-12-12 MED ORDER — NORETHIN-ETH ESTRAD-FE BIPHAS 1 MG-10 MCG / 10 MCG PO TABS: 1.0000 | ORAL_TABLET | Freq: Every day | ORAL | Status: DC

## 2012-12-15 ENCOUNTER — Telehealth: Payer: Self-pay | Admitting: Obstetrics and Gynecology

## 2012-12-15 ENCOUNTER — Encounter: Payer: Self-pay | Admitting: Obstetrics and Gynecology

## 2012-12-15 NOTE — Telephone Encounter (Signed)
I responded to a message from the patient that came through My Chart. Patient with rash, and facial numbness after taking a birth control pill.  I wrote to patient in My Chart to stop taking her birth control pills and I left a voice message for her on her phone to not take any further medication I prescribed with her until we review it further.  I will have the office call the patient again tomorrow.

## 2012-12-16 ENCOUNTER — Encounter: Payer: Self-pay | Admitting: Obstetrics and Gynecology

## 2012-12-16 ENCOUNTER — Telehealth: Payer: Self-pay | Admitting: Obstetrics and Gynecology

## 2012-12-16 MED ORDER — METRONIDAZOLE 500 MG PO TABS: 500.0000 mg | ORAL_TABLET | Freq: Two times a day (BID) | ORAL | Status: DC

## 2012-12-16 NOTE — Telephone Encounter (Signed)
Phone call to patient personally.  Developed a rash on her arms.  Has had numbness in her face like when she had her chest symptoms.  Felt really hot.    Took Zoloft in the past and did not help.  Just picked up a prescription for Flagyl from Dr. Hyacinth Meeker.  Patient states she did not have any contact with Dr. Hyacinth Meeker and does not have any vaginitis symptoms.    Patient and I discussed alternatives to LoLoestrin.  I have discussed Effexor XR and Prempro 0.3/1.5 risks and benefits.  She will do her research and then contact the office back.  She will schedule an appointment if she has further questions.  Patient thanked me for the calls to her.

## 2012-12-16 NOTE — Telephone Encounter (Signed)
Patient called to schedule follow up appointment with Dr. Edward Jolly. Patient says a prescription was called to her pharmacy and she is not what this prescription is. Patient is asking if she needs to take this prescription before making follow up appointment?

## 2012-12-16 NOTE — Telephone Encounter (Signed)
Denise Reese,  I contacted this patient and told her not to take the Flagyl.  This was not prescribed to her.

## 2012-12-16 NOTE — Telephone Encounter (Signed)
After looking at med list in Epic, it appears that Dr. Hyacinth Meeker put in an Rx for Flagyl 500 mg BID for 7 days for this pt. Is this correct?

## 2012-12-19 NOTE — Telephone Encounter (Signed)
See phone note 12-16-12, DR Edward Jolly spoke to patient.

## 2013-01-06 ENCOUNTER — Encounter (HOSPITAL_COMMUNITY): Payer: Self-pay | Admitting: Emergency Medicine

## 2013-01-06 ENCOUNTER — Emergency Department (INDEPENDENT_AMBULATORY_CARE_PROVIDER_SITE_OTHER): Payer: 59

## 2013-01-06 ENCOUNTER — Emergency Department (INDEPENDENT_AMBULATORY_CARE_PROVIDER_SITE_OTHER)
Admission: EM | Admit: 2013-01-06 | Discharge: 2013-01-06 | Disposition: A | Discharge status: Home / Self Care | Payer: 59 | Source: Home / Self Care | Attending: Family Medicine | Admitting: Family Medicine

## 2013-01-06 DIAGNOSIS — S82899A Other fracture of unspecified lower leg, initial encounter for closed fracture: Secondary | ICD-10-CM

## 2013-01-06 DIAGNOSIS — J329 Chronic sinusitis, unspecified: Secondary | ICD-10-CM

## 2013-01-06 DIAGNOSIS — R059 Cough, unspecified: Secondary | ICD-10-CM

## 2013-01-06 DIAGNOSIS — S82832A Other fracture of upper and lower end of left fibula, initial encounter for closed fracture: Secondary | ICD-10-CM

## 2013-01-06 DIAGNOSIS — R05 Cough: Secondary | ICD-10-CM

## 2013-01-06 MED ORDER — TRAMADOL HCL 50 MG PO TABS: 50.0000 mg | ORAL_TABLET | Freq: Four times a day (QID) | ORAL | Status: DC | PRN

## 2013-01-06 NOTE — ED Provider Notes (Signed)
Denise Reese is a 47 y.o. female who presents to Urgent Care today for left ankle pain. Patient suffered an inversion injury in the park Friday. She thinks she tripped on a corn. She notes pain and swelling on the lateral aspect of her ankle. This pain has continued throughout the weekend. She's tried ibuprofen rest ice and elevation which has not helped much. She denies any radiating pain weakness or numbness. The pain is moderate and worse with activity.   Past Medical History  Diagnosis Date  . Heart murmur     as a child   History  Substance Use Topics  . Smoking status: Never Smoker   . Smokeless tobacco: Never Used  . Alcohol Use: No   ROS as above Medications reviewed. No current facility-administered medications for this encounter.   Current Outpatient Prescriptions  Medication Sig Dispense Refill  . dextromethorphan-guaiFENesin (MUCINEX DM) 30-600 MG per 12 hr tablet Take 1 tablet by mouth every 12 (twelve) hours as needed.       . diclofenac sodium (VOLTAREN) 1 % GEL Apply 2 g topically daily.      . metroNIDAZOLE (FLAGYL) 500 MG tablet Take 1 tablet (500 mg total) by mouth 2 (two) times daily. Take for 7 days  14 tablet  0  . Multiple Vitamin (MULITIVITAMIN WITH MINERALS) TABS Take 1 tablet by mouth daily.      . nitroGLYCERIN (NITROSTAT) 0.4 MG SL tablet Place 1 tablet (0.4 mg total) under the tongue every 5 (five) minutes as needed for chest pain.  50 tablet  3  . Norethindrone-Ethinyl Estradiol-Fe Biphas (LO LOESTRIN FE) 1 MG-10 MCG / 10 MCG tablet Take 1 tablet by mouth daily.  1 Package  11  . OVER THE COUNTER MEDICATION AMBEREN.  Menopause symptom relief.  Take 2 tablets, one white and one orange once daily.      . traMADol (ULTRAM) 50 MG tablet Take 1 tablet (50 mg total) by mouth every 6 (six) hours as needed for pain.  15 tablet  0    Exam:  BP 134/69  Pulse 70  Temp(Src) 98.3 F (36.8 C) (Oral)  Resp 18  SpO2 98%  LMP 12/09/2012 Gen: Well NAD LEFT ankle:  Swelling and ecchymosis present laterally. Tender to palpation distal lateral malleolus. Capillary refill sensation intact distally. Table edematous exam.    No results found for this or any previous visit (from the past 24 hour(s)). Dg Ankle Complete Left  01/06/2013   CLINICAL DATA:  Initial encounter for left ankle injury which occurred 2 days ago. Persistent pain and swelling laterally.  EXAM: LEFT ANKLE COMPLETE - 3+ VIEW  COMPARISON:  None.  FINDINGS: Nondisplaced fracture involving the tip of the lateral malleolus. No other fractures. Ankle mortise intact with well preserved joint space. Bone mineral density well-preserved. Large joint effusion/ hemarthrosis. Moderate sized plantar calcaneal spur.  IMPRESSION: Nondisplaced fracture involving the tip of the lateral malleolus.   Electronically Signed   By: Hulan Saas M.D.   On: 01/06/2013 16:56    Assessment and Plan: 47 y.o. female with avulsion type fracture of the lateral malleolus of the left ankle. Patient was placed into a Cam Walker. Tramadol for pain. Followup with Dr. Farris Has at [facility] in about one week. Work note provided     Rodolph Bong, MD 01/06/13 [phone]

## 2013-01-06 NOTE — ED Notes (Signed)
Reports rolling left ankle on Friday around noon. Pt has used ice and elevation with no relief.  Pain with walking states feels like it getting worse.

## 2013-02-06 ENCOUNTER — Other Ambulatory Visit: Payer: Self-pay

## 2013-03-10 ENCOUNTER — Telehealth: Payer: Self-pay | Admitting: Obstetrics and Gynecology

## 2013-03-10 NOTE — Telephone Encounter (Signed)
Patient pressed reschedule at reminder call for her appointment 03/12/13 with Dr. Edward Jolly for a recheck. I called the patient and left her a message to please call us back to reschedule.

## 2013-03-12 ENCOUNTER — Ambulatory Visit: Payer: 59 | Admitting: Obstetrics and Gynecology

## 2013-04-10 ENCOUNTER — Encounter (HOSPITAL_COMMUNITY): Payer: Self-pay | Admitting: Emergency Medicine

## 2013-04-10 ENCOUNTER — Emergency Department (INDEPENDENT_AMBULATORY_CARE_PROVIDER_SITE_OTHER)
Admission: EM | Admit: 2013-04-10 | Discharge: 2013-04-10 | Disposition: A | Discharge status: Home / Self Care | Payer: 59 | Source: Home / Self Care

## 2013-04-10 DIAGNOSIS — M5380 Other specified dorsopathies, site unspecified: Secondary | ICD-10-CM

## 2013-04-10 DIAGNOSIS — M62830 Muscle spasm of back: Secondary | ICD-10-CM

## 2013-04-10 LAB — POCT URINALYSIS DIP (DEVICE)
BILIRUBIN URINE: NEGATIVE
Glucose, UA: NEGATIVE mg/dL
Hgb urine dipstick: NEGATIVE
Leukocytes, UA: NEGATIVE
NITRITE: NEGATIVE
PH: 5.5 (ref 5.0–8.0)
PROTEIN: NEGATIVE mg/dL
Specific Gravity, Urine: <=1.005 (ref 1.005–1.030)
UROBILINOGEN UA: 0.2 mg/dL (ref 0.0–1.0)

## 2013-04-10 MED ORDER — MELOXICAM 15 MG PO TABS: 15.0000 mg | ORAL_TABLET | Freq: Every day | ORAL | Status: DC

## 2013-04-10 MED ORDER — KETOROLAC TROMETHAMINE 60 MG/2ML IM SOLN
60.0000 mg | Freq: Once | INTRAMUSCULAR | Status: AC
  Administered 2013-04-10: 60 mg via INTRAMUSCULAR

## 2013-04-10 MED ORDER — KETOROLAC TROMETHAMINE 60 MG/2ML IM SOLN
INTRAMUSCULAR | Status: AC
  Filled 2013-04-10: qty 2

## 2013-04-10 MED ORDER — TRAMADOL HCL 50 MG PO TABS: 50.0000 mg | ORAL_TABLET | Freq: Four times a day (QID) | ORAL | Status: DC | PRN

## 2013-04-10 MED ORDER — CYCLOBENZAPRINE HCL 5 MG PO TABS: 5.0000 mg | ORAL_TABLET | Freq: Three times a day (TID) | ORAL | Status: DC | PRN

## 2013-04-10 NOTE — ED Provider Notes (Signed)
CSN: [id number]     Arrival date & time 04/10/13  1626 History   None    Chief Complaint  Patient presents with  . Back Pain   (Consider location/radiation/quality/duration/timing/severity/associated sxs/prior Treatment) HPI  Back pain: started 11 days ago. Acute onset while driving for 10 min. 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 Feels like getting worse. Rest w/ some benefit. Worse w/ certain positions. Difficulty carrying things. Denies loss of sensation in toes adn feet, falls, szrs, loss of bowel or bladder fxn. Pain is non-radiating.    Past Medical History  Diagnosis Date  . Heart murmur     as a child   Past Surgical History  Procedure Laterality Date  . Tonsillectomy    . Tubal ligation    . Tubal ligation    . Forearm surgery      plates inserted then removed later   Family History  Problem Relation Age of Onset  . Alcohol abuse Father   . Osteoporosis Paternal Grandmother    History  Substance Use Topics  . Smoking status: Never Smoker   . Smokeless tobacco: Never Used  . Alcohol Use: No   OB History   Grav Para Term Preterm Abortions TAB SAB Ect Mult Living   2 1 1  1 1    1      Review of Systems  Constitutional: Positive for activity change.  Musculoskeletal: Positive for back pain. Negative for gait problem.  All other systems reviewed and are negative.    Allergies  Sulfa antibiotics and Codeine  Home Medications   Current Outpatient Rx  Name  Route  Sig  Dispense  Refill  . cyclobenzaprine (FLEXERIL) 5 MG tablet   Oral   Take 1-2 tablets (5-10 mg total) by mouth 3 (three) times daily as needed for muscle spasms.   30 tablet   0   . dextromethorphan-guaiFENesin (MUCINEX DM) 30-600 MG per 12 hr tablet   Oral   Take 1 tablet by mouth every 12 (twelve) hours as needed.          . diclofenac sodium (VOLTAREN) 1 % GEL   Topical   Apply 2 g topically daily.         . meloxicam (MOBIC) 15 MG tablet  Oral   Take 1 tablet (15 mg total) by mouth daily.   20 tablet   0   . metroNIDAZOLE (FLAGYL) 500 MG tablet   Oral   Take 1 tablet (500 mg total) by mouth 2 (two) times daily. Take for 7 days   14 tablet   0   . Multiple Vitamin (MULITIVITAMIN WITH MINERALS) TABS   Oral   Take 1 tablet by mouth daily.         . nitroGLYCERIN (NITROSTAT) 0.4 MG SL tablet   Sublingual   Place 1 tablet (0.4 mg total) under the tongue every 5 (five) minutes as needed for chest pain.   50 tablet   3   . Norethindrone-Ethinyl Estradiol-Fe Biphas (LO LOESTRIN FE) 1 MG-10 MCG / 10 MCG tablet   Oral   Take 1 tablet by mouth daily.   1 Package   11   . OVER THE COUNTER MEDICATION      AMBEREN.  Menopause symptom relief.  Take 2 tablets, one white and one orange once daily.         . traMADol (ULTRAM) 50 MG tablet   Oral   Take 1 tablet (50  mg total) by mouth every 6 (six) hours as needed for pain.   15 tablet   0   . traMADol (ULTRAM) 50 MG tablet   Oral   Take 1 tablet (50 mg total) by mouth every 6 (six) hours as needed.   30 tablet   0    BP 124/79  Pulse 59  Temp(Src) 97.7 F (36.5 C) (Oral)  Resp 16  SpO2 100%  LMP 03/17/2013 Physical Exam  Constitutional: She appears well-developed and well-nourished. No distress.  HENT:  Head: Normocephalic and atraumatic.  Eyes: EOM are normal. Pupils are equal, round, and reactive to light.  Neck: Normal range of motion.  Cardiovascular: Normal rate, normal heart sounds and intact distal pulses.   Pulmonary/Chest: Effort normal and breath sounds normal. No respiratory distress. She has no wheezes. She has no rales. She exhibits no tenderness.  Abdominal: Soft. She exhibits no distension.  Musculoskeletal:  Lumbar perispinal deep muscles ttp Flexion, extension w/ mild pain Rotation and tilt w/o pain.  Skin: Skin is warm and dry.  Psychiatric: She has a normal mood and affect. Her behavior is normal. Thought content normal.    ED  Course  Procedures (including critical care time) Labs Review Labs Reviewed  POCT URINALYSIS DIP (DEVICE) - Abnormal; Notable for the following:    Ketones, ur TRACE (*)    All other components within normal limits   Imaging Review No results found.  EKG Interpretation    Date/Time:    Ventricular Rate:    PR Interval:    QRS Duration:   QT Interval:    QTC Calculation:   R Axis:     Text Interpretation:              MDM   1. Back spasm    48yo obese f w/ back pain likely from spasm. No evidence of radicular pain/injury. - toradol 60 in office - Tramadol, flexeril, meloxicam (start tomorrow night) - work note for this weekend - rest, heat, massage, stretching. Precautions givn and all questions answered  Linna Darner, MD Family Medicine PGY-3 04/10/2013, 7:09 PM      Waldemar Dickens, MD 04/10/13 [phone]

## 2013-04-10 NOTE — ED Notes (Signed)
Denies injury.  States was driving on [place] when she started with constant pain across low back.  Some positions relieve pain only momentarily.  C/O some pain radiating down into left buttock and left upper leg.  Has tried heat, rest, and IBU without relief.

## 2013-04-10 NOTE — Discharge Instructions (Signed)
You are experiencing pain from a back spasm The medicine we gave you tonight is an excellent pain and inflammation reducer Please start the flexeril tonight to help relax your muscles Use the tramadol as needed for pain that is not treated with the meloxicam Start the meloxicam tomorrow night and only take one per day Rest your back this weekend and do lots of stretching, massage and warm compresses  Muscle Cramps and Spasms Muscle cramps and spasms occur when a muscle or muscles tighten and you have no control over this tightening (involuntary muscle contraction). They are a common problem and can develop in any muscle. The most common place is in the calf muscles of the leg. Both muscle cramps and muscle spasms are involuntary muscle contractions, but they also have differences:   Muscle cramps are sporadic and painful. They may last a few seconds to a quarter of an hour. Muscle cramps are often more forceful and last longer than muscle spasms.  Muscle spasms may or may not be painful. They may also last just a few seconds or much longer. CAUSES  It is uncommon for cramps or spasms to be due to a serious underlying problem. In many cases, the cause of cramps or spasms is unknown. Some common causes are:   Overexertion.   Overuse from repetitive motions (doing the same thing over and over).   Remaining in a certain position for a long period of time.   Improper preparation, form, or technique while performing a sport or activity.   Dehydration.   Injury.   Side effects of some medicines.   Abnormally low levels of the salts and ions in your blood (electrolytes), especially potassium and calcium. This could happen if you are taking water pills (diuretics) or you are pregnant.  Some underlying medical problems can make it more likely to develop cramps or spasms. These include, but are not limited to:   Diabetes.   Parkinson disease.   Hormone disorders, such as thyroid  problems.   Alcohol abuse.   Diseases specific to muscles, joints, and bones.   Blood vessel disease where not enough blood is getting to the muscles.  HOME CARE INSTRUCTIONS   Stay well hydrated. Drink enough water and fluids to keep your urine clear or pale yellow.  It may be helpful to massage, stretch, and relax the affected muscle.  For tight or tense muscles, use a warm towel, heating pad, or hot shower water directed to the affected area.  If you are sore or have pain after a cramp or spasm, applying ice to the affected area may relieve discomfort.  Put ice in a plastic bag.  Place a towel between your skin and the bag.  Leave the ice on for 15-20 minutes, 03-04 times a day.  Medicines used to treat a known cause of cramps or spasms may help reduce their frequency or severity. Only take over-the-counter or prescription medicines as directed by your caregiver. SEEK MEDICAL CARE IF:  Your cramps or spasms get more severe, more frequent, or do not improve over time.  MAKE SURE YOU:   Understand these instructions.  Will watch your condition.  Will get help right away if you are not doing well or get worse. Document Released: 09/09/2001 Document Revised: 07/15/2012 Document Reviewed: 03/06/2012 [facility] Patient Information 2014 [place], [place].

## 2013-04-14 NOTE — ED Provider Notes (Signed)
Medical screening examination/treatment/procedure(s) were performed by resident physician or non-physician practitioner and as supervising physician I was immediately available for consultation/collaboration.   Pauline Good MD.   Billy Fischer, MD 04/14/13 1420

## 2013-07-22 ENCOUNTER — Encounter (HOSPITAL_COMMUNITY): Payer: 59 | Admitting: Anesthesiology

## 2013-07-22 ENCOUNTER — Emergency Department (INDEPENDENT_AMBULATORY_CARE_PROVIDER_SITE_OTHER)
Admission: EM | Admit: 2013-07-22 | Discharge: 2013-07-22 | Disposition: A | Discharge status: Home / Self Care | Payer: 59 | Source: Home / Self Care

## 2013-07-22 ENCOUNTER — Inpatient Hospital Stay (HOSPITAL_COMMUNITY): Payer: 59 | Admitting: Anesthesiology

## 2013-07-22 ENCOUNTER — Encounter (HOSPITAL_COMMUNITY): Payer: Self-pay | Admitting: General Practice

## 2013-07-22 ENCOUNTER — Encounter (HOSPITAL_COMMUNITY): Payer: Self-pay | Admitting: Anesthesiology

## 2013-07-22 ENCOUNTER — Observation Stay (HOSPITAL_COMMUNITY)
Admission: AD | Admit: 2013-07-22 | Discharge: 2013-07-23 | Disposition: A | Discharge status: Home / Self Care | Payer: 59 | Source: Ambulatory Visit | Attending: Surgery | Admitting: Surgery

## 2013-07-22 ENCOUNTER — Inpatient Hospital Stay (HOSPITAL_COMMUNITY): Payer: 59

## 2013-07-22 ENCOUNTER — Encounter (HOSPITAL_COMMUNITY)
Admission: AD | Disposition: A | Discharge status: Home / Self Care | Payer: Self-pay | Source: Ambulatory Visit | Attending: Emergency Medicine

## 2013-07-22 ENCOUNTER — Encounter (HOSPITAL_COMMUNITY): Payer: Self-pay | Admitting: Emergency Medicine

## 2013-07-22 DIAGNOSIS — D72829 Elevated white blood cell count, unspecified: Secondary | ICD-10-CM | POA: Insufficient documentation

## 2013-07-22 DIAGNOSIS — K3580 Unspecified acute appendicitis: Secondary | ICD-10-CM

## 2013-07-22 DIAGNOSIS — R109 Unspecified abdominal pain: Secondary | ICD-10-CM

## 2013-07-22 HISTORY — DX: Unspecified acute appendicitis: K35.80

## 2013-07-22 HISTORY — PX: LAPAROSCOPIC APPENDECTOMY: SHX408

## 2013-07-22 LAB — COMPREHENSIVE METABOLIC PANEL
ALBUMIN: 4 g/dL (ref 3.5–5.2)
ALK PHOS: 55 U/L (ref 39–117)
ALT: 26 U/L (ref 0–35)
AST: 25 U/L (ref 0–37)
BILIRUBIN TOTAL: 0.9 mg/dL (ref 0.3–1.2)
BUN: 9 mg/dL (ref 6–23)
CHLORIDE: 96 meq/L (ref 96–112)
CO2: 25 mEq/L (ref 19–32)
Calcium: 9.2 mg/dL (ref 8.4–10.5)
Creatinine, Ser: 0.75 mg/dL (ref 0.50–1.10)
GFR calc Af Amer: >90 mL/min (ref >90)
GFR calc non Af Amer: >90 mL/min (ref >90)
Glucose, Bld: 123 mg/dL — ABNORMAL HIGH (ref 70–99)
POTASSIUM: 3.8 meq/L (ref 3.7–5.3)
Sodium: 135 mEq/L — ABNORMAL LOW (ref 137–147)
Total Protein: 7.3 g/dL (ref 6.0–8.3)

## 2013-07-22 LAB — CBC
HCT: 36 % (ref 36.0–46.0)
HEMOGLOBIN: 12.4 g/dL (ref 12.0–15.0)
MCH: 30.7 pg (ref 26.0–34.0)
MCHC: 34.4 g/dL (ref 30.0–36.0)
MCV: 89.1 fL (ref 78.0–100.0)
Platelets: 363 10*3/uL (ref 150–400)
RBC: 4.04 MIL/uL (ref 3.87–5.11)
RDW: 13.5 % (ref 11.5–15.5)
WBC: 19.9 10*3/uL — ABNORMAL HIGH (ref 4.0–10.5)

## 2013-07-22 LAB — POCT URINALYSIS DIP (DEVICE)
Bilirubin Urine: NEGATIVE
Glucose, UA: NEGATIVE mg/dL
KETONES UR: NEGATIVE mg/dL
LEUKOCYTES UA: NEGATIVE
Nitrite: NEGATIVE
PH: 7 (ref 5.0–8.0)
PROTEIN: 30 mg/dL — AB
Specific Gravity, Urine: 1.02 (ref 1.005–1.030)
UROBILINOGEN UA: 0.2 mg/dL (ref 0.0–1.0)

## 2013-07-22 LAB — POCT PREGNANCY, URINE: Preg Test, Ur: NEGATIVE

## 2013-07-22 LAB — WET PREP, GENITAL
Clue Cells Wet Prep HPF POC: NONE SEEN
Trich, Wet Prep: NONE SEEN
YEAST WET PREP: NONE SEEN

## 2013-07-22 SURGERY — APPENDECTOMY, LAPAROSCOPIC
Anesthesia: General | Site: Abdomen

## 2013-07-22 MED ORDER — SUCCINYLCHOLINE CHLORIDE 20 MG/ML IJ SOLN
INTRAMUSCULAR | Status: DC | PRN
  Administered 2013-07-22: 100 mg via INTRAVENOUS

## 2013-07-22 MED ORDER — ACETAMINOPHEN 10 MG/ML IV SOLN
1000.0000 mg | Freq: Once | INTRAVENOUS | Status: AC
  Administered 2013-07-22: 1000 mg via INTRAVENOUS
  Filled 2013-07-22: qty 100

## 2013-07-22 MED ORDER — MIDAZOLAM HCL 2 MG/2ML IJ SOLN
INTRAMUSCULAR | Status: AC
  Filled 2013-07-22: qty 2

## 2013-07-22 MED ORDER — ONDANSETRON HCL 4 MG/2ML IJ SOLN
INTRAMUSCULAR | Status: DC | PRN
  Administered 2013-07-22: 4 mg via INTRAVENOUS

## 2013-07-22 MED ORDER — LACTATED RINGERS IR SOLN
Status: DC | PRN
  Administered 2013-07-22: 1000 mL

## 2013-07-22 MED ORDER — PROPOFOL 10 MG/ML IV BOLUS
INTRAVENOUS | Status: AC
  Filled 2013-07-22: qty 20

## 2013-07-22 MED ORDER — ONDANSETRON HCL 4 MG/2ML IJ SOLN
INTRAMUSCULAR | Status: AC
  Filled 2013-07-22: qty 2

## 2013-07-22 MED ORDER — BUPIVACAINE HCL (PF) 0.5 % IJ SOLN
INTRAMUSCULAR | Status: DC | PRN
  Administered 2013-07-22: 17 mL

## 2013-07-22 MED ORDER — KCL-LACTATED RINGERS-D5W 20 MEQ/L IV SOLN
INTRAVENOUS | Status: DC
  Administered 2013-07-23: 03:00:00 via INTRAVENOUS
  Filled 2013-07-22 (×4): qty 1000

## 2013-07-22 MED ORDER — DEXAMETHASONE SODIUM PHOSPHATE 10 MG/ML IJ SOLN
INTRAMUSCULAR | Status: AC
  Filled 2013-07-22: qty 1

## 2013-07-22 MED ORDER — LACTATED RINGERS IV SOLN
INTRAVENOUS | Status: DC | PRN
  Administered 2013-07-22 (×2): via INTRAVENOUS

## 2013-07-22 MED ORDER — ROCURONIUM BROMIDE 100 MG/10ML IV SOLN
INTRAVENOUS | Status: DC | PRN
  Administered 2013-07-22: 30 mg via INTRAVENOUS

## 2013-07-22 MED ORDER — PROMETHAZINE HCL 25 MG/ML IJ SOLN: 6.2500 mg | INTRAMUSCULAR | Status: DC | PRN

## 2013-07-22 MED ORDER — MIDAZOLAM HCL 5 MG/5ML IJ SOLN
INTRAMUSCULAR | Status: DC | PRN
  Administered 2013-07-22: 2 mg via INTRAVENOUS

## 2013-07-22 MED ORDER — IOHEXOL 300 MG/ML  SOLN
50.0000 mL | INTRAMUSCULAR | Status: AC
  Administered 2013-07-22 (×2): 50 mL via ORAL

## 2013-07-22 MED ORDER — SODIUM CHLORIDE 0.9 % IV SOLN
3.0000 g | Freq: Four times a day (QID) | INTRAVENOUS | Status: DC
  Administered 2013-07-23 (×2): 3 g via INTRAVENOUS
  Filled 2013-07-22 (×3): qty 3

## 2013-07-22 MED ORDER — FENTANYL CITRATE 0.05 MG/ML IJ SOLN
INTRAMUSCULAR | Status: AC
  Filled 2013-07-22: qty 5

## 2013-07-22 MED ORDER — ONDANSETRON HCL 4 MG/2ML IJ SOLN: 4.0000 mg | INTRAMUSCULAR | Status: DC | PRN

## 2013-07-22 MED ORDER — ONDANSETRON 8 MG PO TBDP
8.0000 mg | ORAL_TABLET | Freq: Once | ORAL | Status: AC
  Administered 2013-07-22: 8 mg via ORAL
  Filled 2013-07-22: qty 1

## 2013-07-22 MED ORDER — MORPHINE SULFATE 2 MG/ML IJ SOLN: 2.0000 mg | INTRAMUSCULAR | Status: DC | PRN

## 2013-07-22 MED ORDER — LACTATED RINGERS IV BOLUS (SEPSIS)
1000.0000 mL | Freq: Once | INTRAVENOUS | Status: AC
  Administered 2013-07-22: 1000 mL via INTRAVENOUS

## 2013-07-22 MED ORDER — FENTANYL CITRATE 0.05 MG/ML IJ SOLN
50.0000 ug | Freq: Once | INTRAMUSCULAR | Status: AC
  Administered 2013-07-22: 50 ug via INTRAVENOUS
  Filled 2013-07-22: qty 2

## 2013-07-22 MED ORDER — ONDANSETRON HCL 4 MG PO TABS: 4.0000 mg | ORAL_TABLET | Freq: Four times a day (QID) | ORAL | Status: DC | PRN

## 2013-07-22 MED ORDER — IOHEXOL 300 MG/ML  SOLN
100.0000 mL | Freq: Once | INTRAMUSCULAR | Status: AC | PRN
  Administered 2013-07-22: 100 mL via INTRAVENOUS

## 2013-07-22 MED ORDER — FENTANYL CITRATE 0.05 MG/ML IJ SOLN: 25.0000 ug | INTRAMUSCULAR | Status: DC | PRN

## 2013-07-22 MED ORDER — KETOROLAC TROMETHAMINE 60 MG/2ML IM SOLN
60.0000 mg | Freq: Once | INTRAMUSCULAR | Status: AC
  Administered 2013-07-22: 60 mg via INTRAMUSCULAR
  Filled 2013-07-22: qty 2

## 2013-07-22 MED ORDER — MEPERIDINE HCL 50 MG/ML IJ SOLN
6.2500 mg | INTRAMUSCULAR | Status: DC | PRN
  Administered 2013-07-22: 12.5 mg via INTRAVENOUS

## 2013-07-22 MED ORDER — MEPERIDINE HCL 50 MG/ML IJ SOLN
INTRAMUSCULAR | Status: AC
  Filled 2013-07-22: qty 1

## 2013-07-22 MED ORDER — NEOSTIGMINE METHYLSULFATE 1 MG/ML IJ SOLN
INTRAMUSCULAR | Status: DC | PRN
  Administered 2013-07-22: 5 mg via INTRAVENOUS

## 2013-07-22 MED ORDER — GLYCOPYRROLATE 0.2 MG/ML IJ SOLN
INTRAMUSCULAR | Status: DC | PRN
  Administered 2013-07-22: 0.6 mg via INTRAVENOUS

## 2013-07-22 MED ORDER — OXYCODONE-ACETAMINOPHEN 5-325 MG PO TABS
1.0000 | ORAL_TABLET | ORAL | Status: DC | PRN
  Administered 2013-07-23: 1 via ORAL
  Filled 2013-07-22: qty 1

## 2013-07-22 MED ORDER — SODIUM CHLORIDE 0.9 % IV SOLN
INTRAVENOUS | Status: AC
  Filled 2013-07-22: qty 3

## 2013-07-22 MED ORDER — DEXAMETHASONE SODIUM PHOSPHATE 10 MG/ML IJ SOLN
INTRAMUSCULAR | Status: DC | PRN
  Administered 2013-07-22: 10 mg via INTRAVENOUS

## 2013-07-22 MED ORDER — LIDOCAINE HCL (CARDIAC) 20 MG/ML IV SOLN
INTRAVENOUS | Status: DC | PRN
  Administered 2013-07-22: 100 mg via INTRAVENOUS

## 2013-07-22 MED ORDER — FENTANYL CITRATE 0.05 MG/ML IJ SOLN
INTRAMUSCULAR | Status: DC | PRN
  Administered 2013-07-22 (×2): 50 ug via INTRAVENOUS

## 2013-07-22 MED ORDER — SODIUM CHLORIDE 0.9 % IV SOLN
3.0000 g | Freq: Once | INTRAVENOUS | Status: AC
  Administered 2013-07-22: 3 g via INTRAVENOUS
  Filled 2013-07-22: qty 3

## 2013-07-22 MED ORDER — 0.9 % SODIUM CHLORIDE (POUR BTL) OPTIME
TOPICAL | Status: DC | PRN
  Administered 2013-07-22: 1000 mL

## 2013-07-22 MED ORDER — HEPARIN SODIUM (PORCINE) 5000 UNIT/ML IJ SOLN
5000.0000 [IU] | Freq: Three times a day (TID) | INTRAMUSCULAR | Status: DC
  Administered 2013-07-23: 5000 [IU] via SUBCUTANEOUS
  Filled 2013-07-22 (×3): qty 1

## 2013-07-22 MED ORDER — BUPIVACAINE HCL (PF) 0.5 % IJ SOLN
INTRAMUSCULAR | Status: AC
  Filled 2013-07-22: qty 30

## 2013-07-22 MED ORDER — PROPOFOL 10 MG/ML IV BOLUS
INTRAVENOUS | Status: DC | PRN
  Administered 2013-07-22: 200 mg via INTRAVENOUS

## 2013-07-22 MED ORDER — LACTATED RINGERS IV SOLN: INTRAVENOUS | Status: DC

## 2013-07-22 SURGICAL SUPPLY — 50 items
APL SKNCLS STERI-STRIP NONHPOA (GAUZE/BANDAGES/DRESSINGS) ×1
APPLIER CLIP 5 13 M/L LIGAMAX5 (MISCELLANEOUS)
APPLIER CLIP ROT 10 11.4 M/L (STAPLE)
APR CLP MED LRG 11.4X10 (STAPLE)
APR CLP MED LRG 5 ANG JAW (MISCELLANEOUS)
BAG SPEC RTRVL LRG 6X4 10 (ENDOMECHANICALS) ×1
BENZOIN TINCTURE PRP APPL 2/3 (GAUZE/BANDAGES/DRESSINGS) ×2 IMPLANT
CANISTER SUCTION 2500CC (MISCELLANEOUS) ×1 IMPLANT
CHLORAPREP W/TINT 26ML (MISCELLANEOUS) ×2 IMPLANT
CLIP APPLIE 5 13 M/L LIGAMAX5 (MISCELLANEOUS) IMPLANT
CLIP APPLIE ROT 10 11.4 M/L (STAPLE) IMPLANT
CUTTER FLEX LINEAR 45M (STAPLE) ×1 IMPLANT
DECANTER SPIKE VIAL GLASS SM (MISCELLANEOUS) ×1 IMPLANT
DRAIN CHANNEL 19F RND (DRAIN) IMPLANT
DRAPE LAPAROSCOPIC ABDOMINAL (DRAPES) ×2 IMPLANT
DRSG TEGADERM 2-3/8X2-3/4 SM (GAUZE/BANDAGES/DRESSINGS) ×4 IMPLANT
DRSG TEGADERM 4X4.75 (GAUZE/BANDAGES/DRESSINGS) ×1 IMPLANT
ELECT REM PT RETURN 9FT ADLT (ELECTROSURGICAL) ×2
ELECTRODE REM PT RTRN 9FT ADLT (ELECTROSURGICAL) ×1 IMPLANT
ENDOLOOP SUT PDS II  0 18 (SUTURE)
ENDOLOOP SUT PDS II 0 18 (SUTURE) IMPLANT
EVACUATOR SILICONE 100CC (DRAIN) IMPLANT
GAUZE SPONGE 2X2 8PLY STRL LF (GAUZE/BANDAGES/DRESSINGS) IMPLANT
GLOVE BIOGEL PI IND STRL 7.0 (GLOVE) IMPLANT
GLOVE BIOGEL PI INDICATOR 7.0 (GLOVE) ×1
GLOVE ECLIPSE 8.0 STRL XLNG CF (GLOVE) ×2 IMPLANT
GLOVE INDICATOR 8.0 STRL GRN (GLOVE) ×2 IMPLANT
GLOVE SURG SS PI 6.5 STRL IVOR (GLOVE) ×1 IMPLANT
GOWN STRL REUS W/TWL XL LVL3 (GOWN DISPOSABLE) ×4 IMPLANT
KIT BASIN OR (CUSTOM PROCEDURE TRAY) ×2 IMPLANT
MANIFOLD NEPTUNE II (INSTRUMENTS) ×1 IMPLANT
POUCH SPECIMEN RETRIEVAL 10MM (ENDOMECHANICALS) ×2 IMPLANT
RELOAD 45 VASCULAR/THIN (ENDOMECHANICALS) IMPLANT
RELOAD STAPLE 45 2.5 WHT GRN (ENDOMECHANICALS) IMPLANT
RELOAD STAPLE TA45 3.5 REG BLU (ENDOMECHANICALS) ×2 IMPLANT
SCALPEL HARMONIC ACE (MISCELLANEOUS) ×2 IMPLANT
SET IRRIG TUBING LAPAROSCOPIC (IRRIGATION / IRRIGATOR) ×2 IMPLANT
SLEEVE XCEL OPT CAN 5 100 (ENDOMECHANICALS) ×2 IMPLANT
SOLUTION ANTI FOG 6CC (MISCELLANEOUS) ×2 IMPLANT
SPONGE GAUZE 2X2 STER 10/PKG (GAUZE/BANDAGES/DRESSINGS) ×1
STRIP CLOSURE SKIN 1/2X4 (GAUZE/BANDAGES/DRESSINGS) ×2 IMPLANT
SUT ETHILON 3 0 PS 1 (SUTURE) IMPLANT
SUT MNCRL AB 4-0 PS2 18 (SUTURE) ×2 IMPLANT
TOWEL OR 17X26 10 PK STRL BLUE (TOWEL DISPOSABLE) ×2 IMPLANT
TOWEL OR NON WOVEN STRL DISP B (DISPOSABLE) ×2 IMPLANT
TRAY FOLEY CATH 14FRSI W/METER (CATHETERS) ×2 IMPLANT
TRAY LAP CHOLE (CUSTOM PROCEDURE TRAY) ×2 IMPLANT
TROCAR BLADELESS OPT 5 100 (ENDOMECHANICALS) ×2 IMPLANT
TROCAR XCEL BLUNT TIP 100MML (ENDOMECHANICALS) ×2 IMPLANT
TUBING INSUFFLATION 10FT LAP (TUBING) ×2 IMPLANT

## 2013-07-22 NOTE — MAU Note (Signed)
Patient states she started having abdominal pain last night with nausea and vomiting that is getting worse. Was seen at Urgent Care this am and sent to MAU for pelvic ultrasound. Has had a BTL.

## 2013-07-22 NOTE — Progress Notes (Signed)
To [facility] via [facility].

## 2013-07-22 NOTE — Progress Notes (Signed)
RN had attempted x 2 without success

## 2013-07-22 NOTE — ED Notes (Signed)
Reports waking from sleep around 10 pm with lower abdominal pain and n/v.   Denies fever and diarrhea.    No otc meds tried.  Denies urinary symptoms or any abnormal vaginal discharge.

## 2013-07-22 NOTE — Discharge Instructions (Signed)
[facility] ______[facility] LAPAROSCOPIC SURGERY: POST OP INSTRUCTIONS Always review your discharge instruction sheet given to you by the facility where your surgery was performed. IF YOU HAVE DISABILITY OR FAMILY LEAVE FORMS, YOU MUST BRING THEM TO THE OFFICE FOR PROCESSING.   DO NOT GIVE THEM TO YOUR DOCTOR.  1. A prescription for pain medication may be given to you upon discharge.  Take your pain medication as prescribed, if needed.  If narcotic pain medicine is not needed, then you may take acetaminophen (Tylenol) or ibuprofen (Advil) as needed. 2. Take your usually prescribed medications unless otherwise directed. 3. If you need a refill on your pain medication, please contact your pharmacy.  They will contact our office to request authorization. Prescriptions will not be filled after 5pm or on week-ends. 4. You should follow a light diet the first few days after arrival home, such as soup and crackers, etc.  Be sure to include lots of fluids daily. 5. Most patients will experience some swelling and bruising in the area of the incisions.  Ice packs will help.  Swelling and bruising can take several days to resolve.  6. It is common to experience some constipation if taking pain medication after surgery.  Increasing fluid intake and taking a stool softener (such as Colace) will usually help or prevent this problem from occurring.  A mild laxative (Milk of Magnesia or Miralax) should be taken according to package instructions if there are no bowel movements after 48 hours. 7. Unless discharge instructions indicate otherwise, you may remove your bandages 72 hours after surgery, and you may shower at that time.  You may have steri-strips (small skin tapes) in place directly over the incision.  These strips should be left on the skin for 14 days.  If your surgeon used skin glue on the incision, you may shower in 24 hours.  The glue will flake off over the next 2-3 weeks.  Any sutures or  staples will be removed at the office during your follow-up visit. 8. ACTIVITIES:  You may resume regular (light) daily activities beginning the next day--such as daily self-care, walking, climbing stairs--gradually increasing activities as tolerated.  You may have sexual intercourse when it is comfortable.  Refrain from any heavy lifting or straining-nothing over 10 pounds for 2 weeks.  a. You may drive when you are no longer taking prescription pain medication, you can comfortably wear a seatbelt, and you can safely maneuver your car and apply brakes. b. RETURN TO WORK:  Desk work in one week, full duty in 2 weeks.__________________________________________________________ 9. You should see your doctor in the office for a follow-up appointment approximately 2-3 weeks after your surgery.  Make sure that you call for this appointment within a day or two after you arrive home to insure a convenient appointment time. 10. OTHER INSTRUCTIONS: __________________________________________________________________________________________________________________________ __________________________________________________________________________________________________________________________ WHEN TO CALL YOUR DOCTOR: 1. Fever over 101.0 2. Inability to urinate 3. Continued bleeding from incision. 4. Increased pain, redness, or drainage from the incision. 5. Increasing abdominal pain  The clinic staff is available to answer your questions during regular business hours.  Please dont hesitate to call and ask to speak to one of the nurses for clinical concerns.  If you have a medical emergency, go to the nearest emergency room or call 911.  A surgeon from [facility] is always on call at the hospital. [address], [place], [place], [place]  [address] ? [address] [address], [place], [place]  [address] [phone] ? [phone] ? FAX [phone] Web site: [email or web address]

## 2013-07-22 NOTE — ED Notes (Signed)
Bed: WA06 Expected date:  Expected time:  Means of arrival:  Comments: [facility] pt

## 2013-07-22 NOTE — Anesthesia Preprocedure Evaluation (Signed)
Anesthesia Evaluation  Patient identified by MRN, date of birth, ID band Patient awake    Reviewed: Allergy & Precautions, H&P , NPO status , Patient's Chart, lab work & pertinent test results  Airway Mallampati: II TM Distance: >3 FB Neck ROM: Full    Dental no notable dental hx.    Pulmonary neg pulmonary ROS,  breath sounds clear to auscultation  Pulmonary exam normal       Cardiovascular negative cardio ROS  Rhythm:Regular Rate:Normal     Neuro/Psych negative neurological ROS  negative psych ROS   GI/Hepatic negative GI ROS, Neg liver ROS,   Endo/Other  negative endocrine ROS  Renal/GU negative Renal ROS  negative genitourinary   Musculoskeletal negative musculoskeletal ROS (+)   Abdominal   Peds negative pediatric ROS (+)  Hematology negative hematology ROS (+)   Anesthesia Other Findings   Reproductive/Obstetrics negative OB ROS                           Anesthesia Physical Anesthesia Plan  ASA: II and emergent  Anesthesia Plan: General   Post-op Pain Management:    Induction: Intravenous, Rapid sequence and Cricoid pressure planned  Airway Management Planned: Oral ETT  Additional Equipment:   Intra-op Plan:   Post-operative Plan: Extubation in OR  Informed Consent: I have reviewed the patients History and Physical, chart, labs and discussed the procedure including the risks, benefits and alternatives for the proposed anesthesia with the patient or authorized representative who has indicated his/her understanding and acceptance.   Dental advisory given  Plan Discussed with: CRNA  Anesthesia Plan Comments:         Anesthesia Quick Evaluation

## 2013-07-22 NOTE — MAU Note (Signed)
PT's husband will meet pt at [facility]. Pt belongings in bag and will give to [facility] staff for transfer to [facility]. Pt aware

## 2013-07-22 NOTE — ED Provider Notes (Signed)
Medical screening examination/treatment/procedure(s) were performed by non-physician practitioner and as supervising physician I was immediately available for consultation/collaboration.   EKG Interpretation None        Hoy Morn, MD 07/22/13 [phone]

## 2013-07-22 NOTE — ED Provider Notes (Signed)
CSN: [id number]     Arrival date & time 07/22/13  6834 History   None    Chief Complaint  Patient presents with  . Abdominal Pain   (Consider location/radiation/quality/duration/timing/severity/associated sxs/prior Treatment) Patient is a 48 y.o. female presenting with abdominal pain. The history is provided by the patient.  Abdominal Pain Pain location:  Suprapubic Pain quality: cramping and sharp   Pain radiates to:  Does not radiate Pain severity:  Moderate (currently 8/10) Onset quality:  Sudden Duration:  13 hours Progression:  Unchanged Chronicity:  New Context: awakening from sleep   Context: not sick contacts and not suspicious food intake   Context comment:  Awoke from sleep ,felt fine prior to bedtime , with vomiting,, last episode 3hr ago, , no diarrhea. Associated symptoms: vomiting   Associated symptoms: no chest pain, no diarrhea, no dysuria, no fever, no nausea, no vaginal bleeding and no vaginal discharge     Past Medical History  Diagnosis Date  . Heart murmur     as a child   Past Surgical History  Procedure Laterality Date  . Tonsillectomy    . Tubal ligation    . Tubal ligation    . Forearm surgery      plates inserted then removed later   Family History  Problem Relation Age of Onset  . Alcohol abuse Father   . Osteoporosis Paternal Grandmother    History  Substance Use Topics  . Smoking status: Never Smoker   . Smokeless tobacco: Never Used  . Alcohol Use: No   OB History   Grav Para Term Preterm Abortions TAB SAB Ect Mult Living   2 1 1  1 1    1      Review of Systems  Constitutional: Negative.  Negative for fever.  Cardiovascular: Negative for chest pain.  Gastrointestinal: Positive for vomiting and abdominal pain. Negative for nausea and diarrhea.  Genitourinary: Positive for pelvic pain. Negative for dysuria, urgency, frequency, flank pain, vaginal bleeding, vaginal discharge and menstrual problem.  Musculoskeletal: Negative for  back pain.  Skin: Negative.     Allergies  Sulfa antibiotics and Codeine  Home Medications   Prior to Admission medications   Medication Sig Start Date End Date Taking? Authorizing Provider  cyclobenzaprine (FLEXERIL) 5 MG tablet Take 1-2 tablets (5-10 mg total) by mouth 3 (three) times daily as needed for muscle spasms. 04/10/13   Waldemar Dickens, MD  dextromethorphan-guaiFENesin [facility] DM) 30-600 MG per 12 hr tablet Take 1 tablet by mouth every 12 (twelve) hours as needed.     Historical Provider, MD  diclofenac sodium (VOLTAREN) 1 % GEL Apply 2 g topically daily.    Historical Provider, MD  meloxicam (MOBIC) 15 MG tablet Take 1 tablet (15 mg total) by mouth daily. 04/10/13   Waldemar Dickens, MD  metroNIDAZOLE (FLAGYL) 500 MG tablet Take 1 tablet (500 mg total) by mouth 2 (two) times daily. Take for 7 days 12/16/12   Lyman Speller, MD  Multiple Vitamin (MULITIVITAMIN WITH MINERALS) TABS Take 1 tablet by mouth daily.    Historical Provider, MD  nitroGLYCERIN (NITROSTAT) 0.4 MG SL tablet Place 1 tablet (0.4 mg total) under the tongue every 5 (five) minutes as needed for chest pain. 02/07/12   Melony Overly, MD  Norethindrone-Ethinyl Estradiol-Fe Biphas (LO LOESTRIN FE) 1 MG-10 MCG / 10 MCG tablet Take 1 tablet by mouth daily. 12/12/12   [place], MD  OVER THE COUNTER MEDICATION  AMBEREN.  Menopause symptom relief.  Take 2 tablets, one white and one orange once daily.    Historical Provider, MD  traMADol (ULTRAM) 50 MG tablet Take 1 tablet (50 mg total) by mouth every 6 (six) hours as needed for pain. 01/06/13   Gregor Hams, MD  traMADol (ULTRAM) 50 MG tablet Take 1 tablet (50 mg total) by mouth every 6 (six) hours as needed. 04/10/13   Waldemar Dickens, MD   BP 117/75  Pulse 72  Temp(Src) 98.6 F (37 C) (Oral)  Resp 20  SpO2 99%  LMP 06/28/2013 Physical Exam  Constitutional: She is oriented to person, place, and time.  Abdominal: Soft. Bowel sounds are normal.  She exhibits no distension and no mass. There is no hepatosplenomegaly. There is tenderness in the suprapubic area. There is no rigidity, no rebound, no guarding and no CVA tenderness.    Neurological: She is alert and oriented to person, place, and time.  Skin: Skin is warm and dry.    ED Course  Procedures (including critical care time) Labs Review Labs Reviewed  POCT URINALYSIS DIP (DEVICE) - Abnormal; Notable for the following:    Hgb urine dipstick TRACE (*)    Protein, ur 30 (*)    All other components within normal limits    Results for orders placed during the hospital encounter of 07/22/13  POCT URINALYSIS DIP (DEVICE)      Result Value Ref Range   Glucose, UA NEGATIVE  NEGATIVE mg/dL   Bilirubin Urine NEGATIVE  NEGATIVE   Ketones, ur NEGATIVE  NEGATIVE mg/dL   Specific Gravity, Urine 1.020  1.005 - 1.030   Hgb urine dipstick TRACE (*) NEGATIVE   pH 7.0  5.0 - 8.0   Protein, ur 30 (*) NEGATIVE mg/dL   Urobilinogen, UA 0.2  0.0 - 1.0 mg/dL   Nitrite NEGATIVE  NEGATIVE   Leukocytes, UA NEGATIVE  NEGATIVE   Imaging Review No results found.   MDM   1. Abdominal pain in female patient    Sent to [facility] for pelvic u/s.    Billy Fischer, MD 07/22/13 1144

## 2013-07-22 NOTE — Anesthesia Postprocedure Evaluation (Signed)
  Anesthesia Post-op Note  Patient: Denise Reese  Procedure(s) Performed: Procedure(s) (LRB): APPENDECTOMY LAPAROSCOPIC (N/A)  Patient Location: PACU  Anesthesia Type: General  Level of Consciousness: awake and alert   Airway and Oxygen Therapy: Patient Spontanous Breathing  Post-op Pain: mild  Post-op Assessment: Post-op Vital signs reviewed, Patient's Cardiovascular Status Stable, Respiratory Function Stable, Patent Airway and No signs of Nausea or vomiting  Last Vitals:  Filed Vitals:   07/22/13 2300  BP: 116/53  Pulse: 69  Temp: 36.7 C  Resp: 18    Post-op Vital Signs: stable   Complications: No apparent anesthesia complications

## 2013-07-22 NOTE — MAU Note (Signed)
IV attempt R hand unsuccessful. Heat pack to L hand

## 2013-07-22 NOTE — Transfer of Care (Signed)
Immediate Anesthesia Transfer of Care Note  Patient: Denise Reese  Procedure(s) Performed: Procedure(s): APPENDECTOMY LAPAROSCOPIC (N/A)  Patient Location: PACU  Anesthesia Type:General  Level of Consciousness: sedated  Airway & Oxygen Therapy: Patient Spontanous Breathing and Patient connected to face mask oxygen  Post-op Assessment: Report given to PACU RN and Post -op Vital signs reviewed and stable  Post vital signs: Reviewed and stable  Complications: No apparent anesthesia complications

## 2013-07-22 NOTE — MAU Provider Note (Signed)
History     CSN: [id number]  Arrival date and time: 07/22/13 1210   First Provider Initiated Contact with Patient 07/22/13 1313      Chief Complaint  Patient presents with  . Abdominal Pain  . Nausea  . Emesis   HPI  Pt is not pregnant and presents with lower abdominal pain onset last night 10:30pm with nausea and vomiting. Pt's pain started and remains around her umbilicus.  The pain hurts when she walks or moves.   Pt has menses with LMP 06/28/2013.  Pt has not taken anything for the pain.  Pt had IC Sat without any pain. Pt denies constipation, diarrhea- pt denies pain with urination.  Pt was seen at Urgent Care at 9 am and referred here. Pt has also had chills.  Pt last vomited about 8:30 am and has not eaten anything since 5 or 5:30am.     Past Medical History  Diagnosis Date  . Heart murmur     as a child    Past Surgical History  Procedure Laterality Date  . Tonsillectomy    . Tubal ligation    . Tubal ligation    . Forearm surgery      plates inserted then removed later    Family History  Problem Relation Age of Onset  . Alcohol abuse Father   . Osteoporosis Paternal Grandmother     History  Substance Use Topics  . Smoking status: Never Smoker   . Smokeless tobacco: Never Used  . Alcohol Use: No    Allergies:  Allergies  Allergen Reactions  . Sulfa Antibiotics Shortness Of Breath and Swelling  . Codeine Itching    Prescriptions prior to admission  Medication Sig Dispense Refill  . cyclobenzaprine (FLEXERIL) 5 MG tablet Take 1-2 tablets (5-10 mg total) by mouth 3 (three) times daily as needed for muscle spasms.  30 tablet  0  . dextromethorphan-guaiFENesin (MUCINEX DM) 30-600 MG per 12 hr tablet Take 1 tablet by mouth every 12 (twelve) hours as needed.       . diclofenac sodium (VOLTAREN) 1 % GEL Apply 2 g topically daily.      . meloxicam (MOBIC) 15 MG tablet Take 1 tablet (15 mg total) by mouth daily.  20 tablet  0  . metroNIDAZOLE (FLAGYL) 500  MG tablet Take 1 tablet (500 mg total) by mouth 2 (two) times daily. Take for 7 days  14 tablet  0  . Multiple Vitamin (MULITIVITAMIN WITH MINERALS) TABS Take 1 tablet by mouth daily.      . nitroGLYCERIN (NITROSTAT) 0.4 MG SL tablet Place 1 tablet (0.4 mg total) under the tongue every 5 (five) minutes as needed for chest pain.  50 tablet  3  . Norethindrone-Ethinyl Estradiol-Fe Biphas (LO LOESTRIN FE) 1 MG-10 MCG / 10 MCG tablet Take 1 tablet by mouth daily.  1 Package  11  . OVER THE COUNTER MEDICATION AMBEREN.  Menopause symptom relief.  Take 2 tablets, one white and one orange once daily.      . traMADol (ULTRAM) 50 MG tablet Take 1 tablet (50 mg total) by mouth every 6 (six) hours as needed for pain.  15 tablet  0  . traMADol (ULTRAM) 50 MG tablet Take 1 tablet (50 mg total) by mouth every 6 (six) hours as needed.  30 tablet  0    Review of Systems  Constitutional: Positive for chills. Negative for fever.  Gastrointestinal: Positive for nausea, vomiting and abdominal pain. Negative for  diarrhea and constipation.  Genitourinary: Negative for dysuria and urgency.   Physical Exam   Blood pressure 118/59, pulse 70, temperature 98.1 F (36.7 C), temperature source Oral, resp. rate 16, height 5' 5.5" (1.664 m), weight 95.255 kg (210 lb), last menstrual period 06/28/2013, SpO2 100.00%.  Physical Exam  Vitals reviewed. Constitutional: She is oriented to person, place, and time. She appears well-developed and well-nourished.  Uncomfortable appearing  HENT:  Head: Normocephalic.  Eyes: Pupils are equal, round, and reactive to light.  Neck: Normal range of motion.  Cardiovascular: Normal rate.   Respiratory: Effort normal.  GI: Soft. She exhibits no distension. There is tenderness. There is no rebound and no guarding.  Genitourinary: Vagina normal and uterus normal.  Small amount of cream colored discharge in vault; cervix clean mildly tender; uterus and adnexa mildly tender without rebound   Musculoskeletal: Normal range of motion.  Neurological: She is alert and oriented to person, place, and time.  Skin: Skin is warm and dry.  Psychiatric: She has a normal mood and affect.    MAU Course  Procedures  Results for orders placed during the hospital encounter of 07/22/13 (from the past 24 hour(s))  POCT PREGNANCY, URINE     Status: None   Collection Time    07/22/13 12:43 PM      Result Value Ref Range   Preg Test, Ur NEGATIVE  NEGATIVE  CBC     Status: Abnormal   Collection Time    07/22/13  1:33 PM      Result Value Ref Range   WBC 19.9 (*) 4.0 - 10.5 K/uL   RBC 4.04  3.87 - 5.11 MIL/uL   Hemoglobin 12.4  12.0 - 15.0 g/dL   HCT 36.0  36.0 - 46.0 %   MCV 89.1  78.0 - 100.0 fL   MCH 30.7  26.0 - 34.0 pg   MCHC 34.4  30.0 - 36.0 g/dL   RDW 13.5  11.5 - 15.5 %   Platelets 363  150 - 400 K/uL  COMPREHENSIVE METABOLIC PANEL     Status: Abnormal   Collection Time    07/22/13  1:33 PM      Result Value Ref Range   Sodium 135 (*) 137 - 147 mEq/L   Potassium 3.8  3.7 - 5.3 mEq/L   Chloride 96  96 - 112 mEq/L   CO2 25  19 - 32 mEq/L   Glucose, Bld 123 (*) 70 - 99 mg/dL   BUN 9  6 - 23 mg/dL   Creatinine, Ser 0.75  0.50 - 1.10 mg/dL   Calcium 9.2  8.4 - 10.5 mg/dL   Total Protein 7.3  6.0 - 8.3 g/dL   Albumin 4.0  3.5 - 5.2 g/dL   AST 25  0 - 37 U/L   ALT 26  0 - 35 U/L   Alkaline Phosphatase 55  39 - 117 U/L   Total Bilirubin 0.9  0.3 - 1.2 mg/dL   GFR calc non Af Amer >90  >90 mL/min   GFR calc Af Amer >90  >90 mL/min  WET PREP, GENITAL     Status: Abnormal   Collection Time    07/22/13  1:50 PM      Result Value Ref Range   Yeast Wet Prep HPF POC NONE SEEN  NONE SEEN   Trich, Wet Prep NONE SEEN  NONE SEEN   Clue Cells Wet Prep HPF POC NONE SEEN  NONE SEEN   WBC, Wet  Prep HPF POC MODERATE (*) NONE SEEN  will send pt for CT of abd and pelvis IV LR Toradol 60mg  IM Zofran  8mg  ODT given Dr. Loletha Grayer, radiologist called with report of acute appendicitis Dr.  Harolyn Rutherford contacted Discussed with pt CT findings of acute appendicitis Pt will be transferred to [facility] with Dr. Jola Schmidt- accepting ED physician and Dr. Barry Dienes accepting surgeon Assessment and Plan    [place] Pugh 07/22/2013, 1:14 PM

## 2013-07-22 NOTE — Op Note (Signed)
Appendectomy, Lap, Procedure Note  Pre-operative Diagnosis: Acute appendicitis  Post-operative Diagnosis: Same  Procedure:  Laparoscopic appendectomy  Surgeon:  Jackolyn Confer, M.D.  Anesthesia:  General   Indications:  This is a 48 year old female who awoke last night with lower abdominal pain that worsened. She came to the hospital for evaluation and was noted to have acute appendicitis on CT scan. She now presents for the above procedure.  Procedure Details   She was brought to the operating room, placed in the supine position and general anesthesia was induced, along with placement of orogastric tube, SCDs, and a Foley catheter. A timeout was performed. The abdomen was prepped and draped in a sterile fashion. A small infraumbilical incision was made through the skin, subcutaneous tissue, fascia, and peritoneum entering the peritoneal cavity under direct vision.  A pursestring suture was passed around the fascia with a 0 Vicryl.  The Hasson was introduced into the peritoneal cavity and the tails of the suture were used to hold the Hasson in place.   The pneumoperitoneum was then established to steady pressure of 15 mmHg.   The laparoscope was introduced and there was no evidence of bleeding or underlying organ injury. Additional 5 mm cannulas then placed in the left lower quadrant of the abdomen and the right upper quadrant region under direct visualization. A careful evaluation of the entire abdomen was carried out. The patient was placed in Trendelenburg and left lateral decubitus position. The small intestines were retracted in the cephalad and left lateral direction away from the pelvis and right lower quadrant. The patient was found to have an enlarged and inflamed appendix that was extending into the pelvis. There was no evidence of perforation.  The appendix was carefully mobilized. The mesoappendix was was divided with the harmonic scalpel.   The appendix was amputated off the cecum,  with a small cuff of cecum, using an endo-GIA stapler.  The appendix was placed in a retrieval bag and removed through the subumbilical port incision.    There was no evidence of bleeding, leakage, or complication from the staple after removal of the appendix. Copious irrigation was  performed and irrigant fluid suctioned from the abdomen as much as possible.  The umbilical trocar was removed and the  port site fascia was closed via the purse string suture under laparoscopic vision. There was no residual palpable fascial defect.  The remaining trocars were removed and all  trocar site skin wounds were closed with 4-0 Monocryl subcuticular stitches. Steri-Strips and sterile dressings were applied.  Instrument, sponge, and needle counts were correct at the conclusion of the case. She tolerated the procedure well without any apparent complications.  Findings: The appendix was found to be inflamed. There were not signs of necrosis.  There was not perforation. There was not abscess formation.  Estimated Blood Loss:  less than 100 mL         Drains: none         Specimens: Appendix         Complications:  None; patient tolerated the procedure well.         Disposition: PACU - hemodynamically stable.         Condition: stable

## 2013-07-22 NOTE — MAU Note (Signed)
Sonia Baller CRNA called and will come start IV. Pt aware

## 2013-07-22 NOTE — Discharge Instructions (Signed)
Go directly [facility] for [facility]

## 2013-07-22 NOTE — ED Notes (Signed)
Received from [facility] via [facility]. Patient had a CT that showed appendicitis. patient had a #20 LH with LR at 75cc/hr

## 2013-07-22 NOTE — MAU Note (Signed)
Radiology in with contrast and explaining how to drink

## 2013-07-22 NOTE — MAU Note (Signed)
pregnancy test negative

## 2013-07-22 NOTE — H&P (Signed)
Denise Reese is an 48 y.o. female.   Chief Complaint: Lower abdominal pain HPI:   She awoke last night with lower abdominal pain.  This progressively worsened and became associated with nausea and vomiting. She had a subjective fever and some chills. No dysuria. No diarrhea. She presented to the [facility] and was evaluated there. She was noted to have a leukocytosis. A CT scan was consistent with acute appendicitis. She was subsequently transferred here for further evaluation and treatment.  Her husband is in the room with her.  Past Medical History  Diagnosis Date  . Heart murmur     as a child    Past Surgical History  Procedure Laterality Date  . Tonsillectomy    . Tubal ligation    . Tubal ligation    . Forearm surgery      plates inserted then removed later    Family History  Problem Relation Age of Onset  . Alcohol abuse Father   . Osteoporosis Paternal Grandmother    Social History:  reports that she has never smoked. She has never used smokeless tobacco. She reports that she does not drink alcohol or use illicit drugs.  Allergies:  Allergies  Allergen Reactions  . Sulfa Antibiotics Shortness Of Breath and Swelling  . Codeine Itching     (Not in a hospital admission)  Results for orders placed during the hospital encounter of 07/22/13 (from the past 48 hour(s))  POCT PREGNANCY, URINE     Status: None   Collection Time    07/22/13 12:43 PM      Result Value Ref Range   Preg Test, Ur NEGATIVE  NEGATIVE   Comment:            THE SENSITIVITY OF THIS     METHODOLOGY IS >24 mIU/mL  CBC     Status: Abnormal   Collection Time    07/22/13  1:33 PM      Result Value Ref Range   WBC 19.9 (*) 4.0 - 10.5 K/uL   RBC 4.04  3.87 - 5.11 MIL/uL   Hemoglobin 12.4  12.0 - 15.0 g/dL   HCT 36.0  36.0 - 46.0 %   MCV 89.1  78.0 - 100.0 fL   MCH 30.7  26.0 - 34.0 pg   MCHC 34.4  30.0 - 36.0 g/dL   RDW 13.5  11.5 - 15.5 %   Platelets 363  150 - 400 K/uL   COMPREHENSIVE METABOLIC PANEL     Status: Abnormal   Collection Time    07/22/13  1:33 PM      Result Value Ref Range   Sodium 135 (*) 137 - 147 mEq/L   Potassium 3.8  3.7 - 5.3 mEq/L   Chloride 96  96 - 112 mEq/L   CO2 25  19 - 32 mEq/L   Glucose, Bld 123 (*) 70 - 99 mg/dL   BUN 9  6 - 23 mg/dL   Creatinine, Ser 0.75  0.50 - 1.10 mg/dL   Calcium 9.2  8.4 - 10.5 mg/dL   Total Protein 7.3  6.0 - 8.3 g/dL   Albumin 4.0  3.5 - 5.2 g/dL   AST 25  0 - 37 U/L   ALT 26  0 - 35 U/L   Alkaline Phosphatase 55  39 - 117 U/L   Total Bilirubin 0.9  0.3 - 1.2 mg/dL   GFR calc non Af Amer >90  >90 mL/min   GFR calc Af Amer >90  >  90 mL/min   Comment: (NOTE)     The eGFR has been calculated using the CKD EPI equation.     This calculation has not been validated in all clinical situations.     eGFR's persistently <90 mL/min signify possible Chronic Kidney     Disease.  WET PREP, GENITAL     Status: Abnormal   Collection Time    07/22/13  1:50 PM      Result Value Ref Range   Yeast Wet Prep HPF POC NONE SEEN  NONE SEEN   Trich, Wet Prep NONE SEEN  NONE SEEN   Clue Cells Wet Prep HPF POC NONE SEEN  NONE SEEN   WBC, Wet Prep HPF POC MODERATE (*) NONE SEEN   Comment: BACTERIA- TOO NUMEROUS TO COUNT   Ct Abdomen Pelvis W Contrast  07/22/2013   CLINICAL DATA:  Abdominal and pelvic pain with elevated white blood cell count. History of tubal ligation.  EXAM: CT ABDOMEN AND PELVIS WITH CONTRAST  TECHNIQUE: Multidetector CT imaging of the abdomen and pelvis was performed using the standard protocol following bolus administration of intravenous contrast.  CONTRAST:  169m OMNIPAQUE IOHEXOL 300 MG/ML  SOLN  COMPARISON:  None.  FINDINGS: A small amount of dependent subsegmental atelectasis is present in the lung bases. There is no pleural effusion.  The liver is diffusely low in attenuation, compatible with mild to moderate hepatic steatosis. The gallbladder, spleen, adrenal glands, kidneys, and pancreas have  an unremarkable enhanced appearance.  Oral contrast is present in multiple loops of nondilated small bowel. The appendix is identified in the right lower quadrant and is fluid filled and dilated, measuring 11 mm in diameter, with mild surrounding inflammatory change. Apparent mild wall thickening of the terminal ileum may be due to the adjacent inflammation or may be artifactual due to underdistention. A few subcentimeter right lower quadrant lymph nodes are noted.  There is no intraperitoneal free fluid or free air. The colon is unremarkable. A fibroid is present in the anterior uterine fundus measuring 5.8 cm in AP dimension. The ovaries are identified. A 2.5 cm right ovarian cyst is incidentally noted. The osseous structures are unremarkable.  IMPRESSION: 1. Acute appendicitis. 2. Hepatic steatosis. 3. Uterine fibroid. These results were called by telephone at the time of interpretation on 07/22/2013 at 4:44 PM to [facility], who verbally acknowledged these results.   Electronically Signed   By: ALogan Bores  On: 07/22/2013 16:48    Review of Systems  Constitutional: Positive for fever and chills. Negative for weight loss.  HENT: Negative for congestion and sore throat.   Respiratory: Negative.   Cardiovascular: Negative.   Gastrointestinal: Positive for nausea, vomiting and abdominal pain. Negative for diarrhea.  Genitourinary: Negative for dysuria and hematuria.  Endo/Heme/Allergies: Does not bruise/bleed easily.    Blood pressure 116/62, pulse 85, temperature 98.3 F (36.8 C), temperature source Oral, resp. rate 16, height 5' 5.5" (1.664 m), weight 210 lb (95.255 kg), last menstrual period 06/28/2013, SpO2 96.00%. Physical Exam  Constitutional: No distress.  Overweight female  HENT:  Head: Normocephalic and atraumatic.  Eyes: EOM are normal. No scleral icterus.  Cardiovascular: Normal rate and regular rhythm.   Respiratory: Effort normal and breath sounds normal.  GI: Soft. She  exhibits no mass. There is tenderness (Right lower quadrant tenderness with mild guarding).  Musculoskeletal: She exhibits no edema.  Lymphadenopathy:    She has no cervical adenopathy.  Neurological: She is alert.  Skin: Skin is  warm and dry.  Psychiatric: She has a normal mood and affect. Her behavior is normal.     Assessment/Plan Acute appendicitis.  Plan: Admit. IV antibiotic. Laparoscopic possible open appendectomy.  I have discussed the procedure and risks of appendectomy. The risks include but are not limited to bleeding, infection, wound problems, anesthesia, injury to intra-abdominal organs, need for re-operation,possibility of postoperative ileus. She seems to understand and agrees with the plan.  Rhunette Croft Dawnell Bryant 07/22/2013, 8:35 PM

## 2013-07-22 NOTE — ED Provider Notes (Signed)
Denise Reese is a 48 y.o. female who was transferred here from [facility] hospital with diagnoses of acute appendicitis. Patient reports lower abdominal pain, nausea, vomiting approximately 13 hours. Patient went to urgent care and was sent to [facility] hospital for further evaluation. He was hostile patient had lab work done and a CT scan which showed acute appendicitis. Patient was sent here per Dr. Barry Dienes to see.   Patient is in no acute distress, right and left lower abdominal tenderness is present. No guarding. Patient asking for some more pain medications at this time. Paged Dr. Barry Dienes.   Renold Genta, PA-C 07/22/13 1855

## 2013-07-22 NOTE — MAU Note (Signed)
Ambulated to [facility] and then to stretcher for transfer to [facility].

## 2013-07-23 ENCOUNTER — Encounter (HOSPITAL_COMMUNITY): Payer: Self-pay | Admitting: General Surgery

## 2013-07-23 LAB — GC/CHLAMYDIA PROBE AMP
CT PROBE, AMP APTIMA: NEGATIVE
GC PROBE AMP APTIMA: NEGATIVE

## 2013-07-23 MED ORDER — OXYCODONE HCL 5 MG PO TABS: 5.0000 mg | ORAL_TABLET | ORAL | Status: DC | PRN

## 2013-07-23 NOTE — Care Management Note (Signed)
    Page 1 of 1   07/23/2013     11:13:05 AM CARE MANAGEMENT NOTE 07/23/2013  Patient:  Denise Reese, Denise Reese   Account Number:  [id number]  Date Initiated:  07/23/2013  Documentation initiated by:  Sunday Spillers  Subjective/Objective Assessment:   48 yo female admitted s/p lap appy     Action/Plan:   Home when stable   Anticipated DC Date:  07/23/2013   Anticipated DC Plan:  [place]  CM consult      Choice offered to / List presented to:             Status of service:  Completed, signed off Medicare Important Message given?   (If response is "NO", the following Medicare IM given date fields will be blank) Date Medicare IM given:   Date Additional Medicare IM given:    Discharge Disposition:  HOME/SELF CARE  Per UR Regulation:  Reviewed for med. necessity/level of care/duration of stay  If discussed at [place] of Stay Meetings, dates discussed:    Comments:

## 2013-07-23 NOTE — Discharge Summary (Signed)
Seen, agree with above..  Home today.

## 2013-07-23 NOTE — Discharge Summary (Signed)
Patient ID: Denise Reese MRN: [id number] DOB/AGE: March 07, 1966 48 y.o.  Admit date: 07/22/2013 Discharge date: 07/23/2013  Procedures: lap appy  Consults: None  Reason for Admission:  She awoke last night with lower abdominal pain. This progressively worsened and became associated with nausea and vomiting. She had a subjective fever and some chills. No dysuria. No diarrhea. [facility] She was noted to have a leukocytosis. A CT scan was consistent with acute appendicitis. She was subsequently transferred here for further evaluation and treatment.  Admission Diagnoses:  1. Acute appendicitis  Hospital Course: The patient was admitted and taken to the OR where she underwent a lap appy.  She tolerated the procedure well. On POD 1, she was tolerating a regular diet and her pain was well controlled.  She was stable for dc home.  PE: Abd: soft, appropriately tender, +BS, ND, incisions c/d/i  Discharge Diagnoses:  Active Problems:   Acute appendicitis s/p lap appy  Discharge Medications:   Medication List         acetaminophen 500 MG tablet  Commonly known as:  TYLENOL  Take 1,000 mg by mouth daily as needed for moderate pain.     dextromethorphan-guaiFENesin 30-600 MG per 12 hr tablet  Commonly known as:  MUCINEX DM  Take 1 tablet by mouth every 12 (twelve) hours as needed for cough.     Fish Oil 1200 MG Caps  Take 1 capsule by mouth daily.     ibuprofen 200 MG tablet  Commonly known as:  ADVIL,MOTRIN  Take 400 mg by mouth daily as needed for moderate pain.     multivitamin with minerals Tabs tablet  Take 1 tablet by mouth daily.     oxyCODONE 5 MG immediate release tablet  Commonly known as:  ROXICODONE  Take 1-2 tablets (5-10 mg total) by mouth every 4 (four) hours as needed for severe pain.        Discharge Instructions:     Follow-up Information   Follow up with [facility] [place] On 08/12/2013. (3:15pm,  arrive at 2:45pm for paperwork)    Contact information:   [address]   [place] [address] [phone]       Signed: Henreitta Cea 07/23/2013, 9:30 AM

## 2013-07-24 ENCOUNTER — Telehealth (INDEPENDENT_AMBULATORY_CARE_PROVIDER_SITE_OTHER): Payer: Self-pay | Admitting: General Surgery

## 2013-07-24 DIAGNOSIS — Z9049 Acquired absence of other specified parts of digestive tract: Secondary | ICD-10-CM

## 2013-07-24 MED ORDER — TRAMADOL HCL 50 MG PO TABS: 50.0000 mg | ORAL_TABLET | ORAL | Status: DC | PRN

## 2013-07-24 NOTE — Addendum Note (Signed)
Addended by: Flossie Buffy on: 07/24/2013 11:14 AM   Modules accepted: Orders

## 2013-07-24 NOTE — MAU Provider Note (Signed)
Attestation of Attending Supervision of Advanced Practitioner (PA/CNM/NP): Evaluation and management procedures were performed by the Advanced Practitioner under my supervision and collaboration.  I have reviewed the Advanced Practitioner's note and chart, and I agree with the management and plan. I did arrange for transfer of the patient to [facility].  Verita Schneiders, MD, [place] Attending [place], [place]

## 2013-07-24 NOTE — Telephone Encounter (Signed)
Patient called in 2 days s/p lap appy.  She was d/c with percocet and it is now causing her to itch.  She does have an allergic reaction to codeine.  She informed us that she has been fine in the past using tramadol 50 mg and was wondering if Dr. Zella Richer would be willing to change this perscription for her.  Informed her that I would send this message to him and his assistant and that as soon as we hear something back, we would get in touch with her.

## 2013-07-24 NOTE — Telephone Encounter (Signed)
Have her stop the Percocet.  May give her Tramadol 50mg , one tab p.o. q 4hrs prn pain, Disp # 30, RF = 0

## 2013-07-24 NOTE — Telephone Encounter (Signed)
Called patient and informed patient that Rx has been signed and is ready for pick up from our office.

## 2013-08-04 ENCOUNTER — Other Ambulatory Visit (INDEPENDENT_AMBULATORY_CARE_PROVIDER_SITE_OTHER): Payer: Self-pay | Admitting: General Surgery

## 2013-08-12 ENCOUNTER — Encounter (INDEPENDENT_AMBULATORY_CARE_PROVIDER_SITE_OTHER): Payer: Self-pay | Admitting: General Surgery

## 2013-08-12 ENCOUNTER — Ambulatory Visit (INDEPENDENT_AMBULATORY_CARE_PROVIDER_SITE_OTHER): Payer: 59 | Admitting: General Surgery

## 2013-08-12 ENCOUNTER — Telehealth (INDEPENDENT_AMBULATORY_CARE_PROVIDER_SITE_OTHER): Payer: Self-pay

## 2013-08-12 VITALS — BP 122/70 | HR 68 | Temp 97.6°F | Ht 66.0 in | Wt 208.0 lb

## 2013-08-12 DIAGNOSIS — K3580 Unspecified acute appendicitis: Secondary | ICD-10-CM

## 2013-08-12 NOTE — Telephone Encounter (Signed)
Refill request from pharmacy for Tramadol approved by Dr. Zella Richer.  Patient may p/u at front desk.

## 2013-08-12 NOTE — Patient Instructions (Signed)
Follow up as needed

## 2013-08-12 NOTE — Progress Notes (Signed)
Denise Reese [facility] 1965-09-11 [id number] 08/12/2013   History of Present Illness: Denise Reese is a  48 y.o. female who presents today status post lap appy by Dr. Jackolyn Confer.  Pathology reveals transmural acute appendicitis and periappendicitis.  The patient is tolerating a regular diet, having normal bowel movements, has good pain control.  She  is back to most normal activities.   Physical Exam: Abd: soft, nontender, active bowel sounds, nondistended.  All incisions are well healed.  Impression: 1.  Acute appendicitis, s/p lap appy  Plan: She  is able to return to normal activities. She  may follow up on a prn basis.

## 2013-08-12 NOTE — Telephone Encounter (Signed)
Patient return Denise Reese's call.  Patient advised that her Tramadol prescription has been approved and at the front desk for pick up.

## 2013-09-11 ENCOUNTER — Other Ambulatory Visit: Payer: Self-pay | Admitting: Physician Assistant

## 2013-09-11 DIAGNOSIS — N949 Unspecified condition associated with female genital organs and menstrual cycle: Secondary | ICD-10-CM

## 2013-09-11 DIAGNOSIS — N938 Other specified abnormal uterine and vaginal bleeding: Secondary | ICD-10-CM

## 2013-09-11 DIAGNOSIS — N925 Other specified irregular menstruation: Secondary | ICD-10-CM

## 2013-09-15 ENCOUNTER — Ambulatory Visit
Admission: RE | Admit: 2013-09-15 | Discharge: 2013-09-15 | Disposition: A | Discharge status: Home / Self Care | Payer: 59 | Source: Ambulatory Visit | Attending: Physician Assistant | Admitting: Physician Assistant

## 2013-09-15 DIAGNOSIS — N949 Unspecified condition associated with female genital organs and menstrual cycle: Secondary | ICD-10-CM

## 2013-09-15 DIAGNOSIS — N938 Other specified abnormal uterine and vaginal bleeding: Secondary | ICD-10-CM

## 2013-09-15 DIAGNOSIS — N925 Other specified irregular menstruation: Secondary | ICD-10-CM

## 2013-09-17 ENCOUNTER — Telehealth (INDEPENDENT_AMBULATORY_CARE_PROVIDER_SITE_OTHER): Payer: Self-pay

## 2013-09-17 NOTE — Telephone Encounter (Signed)
Pt s/p lap appy on 07/22/13. Pt states that she has started exercising again and has noticed a increase in her pain. Pt states that this pain is relieved after taking Tylenol. Advised pt that if she needed to she could also place heat on the area to help with pain relief. Advised pt to watch for increased pain that is not relieved by Tylenol, fevers, chills, or n/v. Advised pt that she could call our office back  Pt verbalized understanding.

## 2013-11-12 ENCOUNTER — Encounter: Payer: Self-pay | Admitting: Obstetrics and Gynecology

## 2013-11-12 ENCOUNTER — Ambulatory Visit (INDEPENDENT_AMBULATORY_CARE_PROVIDER_SITE_OTHER): Payer: 59 | Admitting: Obstetrics and Gynecology

## 2013-11-12 VITALS — BP 100/66 | HR 70 | Resp 20 | Ht 65.5 in | Wt 212.2 lb

## 2013-11-12 DIAGNOSIS — R5383 Other fatigue: Secondary | ICD-10-CM

## 2013-11-12 DIAGNOSIS — R5381 Other malaise: Secondary | ICD-10-CM

## 2013-11-12 DIAGNOSIS — Z01419 Encounter for gynecological examination (general) (routine) without abnormal findings: Secondary | ICD-10-CM

## 2013-11-12 LAB — COMPREHENSIVE METABOLIC PANEL
ALBUMIN: 4.6 g/dL (ref 3.5–5.2)
ALT: 23 U/L (ref 0–35)
AST: 20 U/L (ref 0–37)
Alkaline Phosphatase: 55 U/L (ref 39–117)
BUN: 15 mg/dL (ref 6–23)
CALCIUM: 9.4 mg/dL (ref 8.4–10.5)
CHLORIDE: 102 meq/L (ref 96–112)
CO2: 29 meq/L (ref 19–32)
Creat: 1.16 mg/dL — ABNORMAL HIGH (ref 0.50–1.10)
GLUCOSE: 83 mg/dL (ref 70–99)
Potassium: 4.2 mEq/L (ref 3.5–5.3)
Sodium: 138 mEq/L (ref 135–145)
Total Bilirubin: 0.5 mg/dL (ref 0.2–1.2)
Total Protein: 6.9 g/dL (ref 6.0–8.3)

## 2013-11-12 LAB — TSH: TSH: 3.098 u[IU]/mL (ref 0.350–4.500)

## 2013-11-12 LAB — CBC
HEMATOCRIT: 36.5 % (ref 36.0–46.0)
HEMOGLOBIN: 12.5 g/dL (ref 12.0–15.0)
MCH: 30.6 pg (ref 26.0–34.0)
MCHC: 34.2 g/dL (ref 30.0–36.0)
MCV: 89.2 fL (ref 78.0–100.0)
Platelets: 347 10*3/uL (ref 150–400)
RBC: 4.09 MIL/uL (ref 3.87–5.11)
RDW: 14.9 % (ref 11.5–15.5)
WBC: 11.3 10*3/uL — AB (ref 4.0–10.5)

## 2013-11-12 LAB — LIPID PANEL
CHOLESTEROL: 187 mg/dL (ref 0–200)
HDL: 54 mg/dL (ref >39)
LDL Cholesterol: 67 mg/dL (ref 0–99)
Total CHOL/HDL Ratio: 3.5 Ratio
Triglycerides: 328 mg/dL — ABNORMAL HIGH (ref <150)
VLDL: 66 mg/dL — ABNORMAL HIGH (ref 0–40)

## 2013-11-12 NOTE — Progress Notes (Signed)
Patient ID: Denise Reese, female   DOB: Aug 02, 1965, 48 y.o.   MRN: [id number] GYNECOLOGY VISIT  PCP:   Fawn Kirk, MD  Referring provider:   HPI: 48 y.o.   Married  Caucasian  female   [id number] with Patient's last menstrual period was 11/12/2013.  Previous cycle was 10-22-13. here for  AEX.  Menses 12/15 - 12/21, 1/26 - 2/2, 2/23 - 3/6, 3/28 - 4/6, 5/1 - 5/12, 6/2 - 610. Can be light or heavy.  Some cramping.  A lot of irritability.  Swelling.  Menses do not interfere with her life and does not wish intervention.  Had appy this year.  CT 07/22/13 showed 5.8 cm uterine fibroid and 2.5 cm right ovarian cyst and follow up ultrasound 09/15/13 showed no fibroid and normal left ovary and nonvisualization right ovary.   Hgb:    12.4 with PCP on 07-22-13 Urine:  PCP  GYNECOLOGIC HISTORY: Patient's last menstrual period was 11/12/2013. Sexually active:  yes Partner preference: female Contraception:  Tubal  Menopausal hormone therapy: n/a DES exposure:   no Blood transfusions:  no  Sexually transmitted diseases: no   GYN procedures and prior surgeries:  Tubal ligation Last mammogram:  11-27-12 scattered fibrocystic changes.  Asymmetry in right breast, ultrasound performed and hypoechoic lesion consistent with intramammary lymph node.  Recommend routine screening 1 year.              Last pap and high risk HPV testing:   10/24/12 - WNL, negative HR HPV History of abnormal pap smear:  no   OB History   Grav Para Term Preterm Abortions TAB SAB Ect Mult Living   2 1 1  1 1    1        LIFESTYLE: Exercise: walking            Tobacco: no Alcohol:    no Drug use:  no  OTHER HEALTH MAINTENANCE: Tetanus/TDap:  2013 Gardisil:             n/a Influenza:          01/2013 Zostavax:          n/a  Bone density:    1990's: normal Colonoscopy:    never  Cholesterol check:   Family History  Problem Relation Age of Onset  . Alcohol abuse Father   . Osteoporosis Paternal Grandmother   .  Cancer Maternal Grandfather     Patient Active Problem List   Diagnosis Date Noted  . Acute appendicitis 07/22/2013  . Joint pain 01/19/2012  . Health maintenance examination 09/24/2011  . Overweight 09/24/2011  . Atypical chest pain 08/05/2011  . Elevated TSH 08/05/2011  . Hypertension 08/05/2011   Past Medical History  Diagnosis Date  . Heart murmur     as a child    Past Surgical History  Procedure Laterality Date  . Tonsillectomy    . Tubal ligation    . Tubal ligation    . Forearm surgery      plates inserted then removed later  . Laparoscopic appendectomy N/A 07/22/2013    Procedure: APPENDECTOMY LAPAROSCOPIC;  Surgeon: Odis Hollingshead, MD;  Location: [facility];  Service: General;  Laterality: N/A;    ALLERGIES: Sulfa antibiotics and Codeine  Current Outpatient Prescriptions  Medication Sig Dispense Refill  . acetaminophen (TYLENOL) 500 MG tablet Take 1,000 mg by mouth daily as needed for moderate pain.      Marland Kitchen dextromethorphan-guaiFENesin (MUCINEX DM) 30-600 MG per 12 hr tablet Take  1 tablet by mouth every 12 (twelve) hours as needed for cough.       Marland Kitchen ibuprofen (ADVIL,MOTRIN) 200 MG tablet Take 400 mg by mouth daily as needed for moderate pain.      . Misc Natural Products (OSTEO BI-FLEX JOINT SHIELD PO) Take 1 tablet by mouth daily.      . Multiple Vitamin (MULITIVITAMIN WITH MINERALS) TABS Take 1 tablet by mouth daily.      . Omega-3 Fatty Acids (FISH OIL) 1200 MG CAPS Take 1 capsule by mouth daily.       No current facility-administered medications for this visit.     ROS:  Pertinent items are noted in HPI.  History   Social History  . Marital Status: Married    Spouse Name: N/A    Number of Children: N/A  . Years of Education: N/A   Occupational History  . Not on file.   Social History Main Topics  . Smoking status: Never Smoker   . Smokeless tobacco: Never Used  . Alcohol Use: No  . Drug Use: No  . Sexual Activity: Yes    Partners: Male     Birth Control/ Protection: Surgical     Comment: Tubal   Other Topics Concern  . Not on file   Social History Narrative   Works at [employer]   1 child 17y.o. Living at home, lives with husband- married 7 years ago.           PHYSICAL EXAMINATION:    BP 100/66  Pulse 70  Resp 20  Ht 5' 5.5" (1.664 m)  Wt 212 lb 3.2 oz (96.253 kg)  BMI 34.76 kg/m2  LMP 11/12/2013   Wt Readings from Last 3 Encounters:  11/12/13 212 lb 3.2 oz (96.253 kg)  08/12/13 208 lb (94.348 kg)  07/22/13 210 lb (95.255 kg)     Ht Readings from Last 3 Encounters:  11/12/13 5' 5.5" (1.664 m)  08/12/13 5\' 6"  (1.676 m)  07/22/13 5' 5.5" (1.664 m)    General appearance: alert, cooperative and appears stated age Head: Normocephalic, without obvious abnormality, atraumatic Neck: no adenopathy, supple, symmetrical, trachea midline and thyroid not enlarged, symmetric, no tenderness/mass/nodules Lungs: clear to auscultation bilaterally Breasts: Inspection negative, No nipple retraction or dimpling, No nipple discharge or bleeding, No axillary or supraclavicular adenopathy, Normal to palpation without dominant masses Heart: regular rate and rhythm Abdomen: soft, non-tender; no masses,  no organomegaly Extremities: extremities normal, atraumatic, no cyanosis or edema Skin: Skin color, texture, turgor normal. No rashes or lesions Lymph nodes: Cervical, supraclavicular, and axillary nodes normal. No abnormal inguinal nodes palpated Neurologic: Grossly normal  Pelvic: External genitalia:  no lesions              Urethra:  normal appearing urethra with no masses, tenderness or lesions              Bartholins and Skenes: normal                 Vagina: normal appearing vagina with normal color and discharge, no lesions              Cervix: normal appearance              Pap and high risk HPV testing done: No.         Bimanual Exam:  Uterus:  uterus is elongated and firm fundus  suspicious for 3 cm fundal fibroid, shape, consistency and nontender  Adnexa: normal adnexa in size, nontender and no masses                                      Rectovaginal: yes                                      Confirms above.                                      Anus:  normal sphincter tone, no lesions  ASSESSMENT  Normal gynecologic exam. Monitor menses and return if has prolonged bleeding or increased pain.   PLAN  Mammogram recommended yearly starting at age 59. Pap smear and high risk HPV testing as above. Counseled on self breast exam, Calcium and vitamin D intake, exercise. See lab orders yes Monitor menses and return if has prolonged bleeding or increased pain.  Return annually or prn   An After Visit Summary was printed and given to the patient.

## 2013-11-13 ENCOUNTER — Other Ambulatory Visit: Payer: Self-pay

## 2013-11-13 DIAGNOSIS — Z1231 Encounter for screening mammogram for malignant neoplasm of breast: Secondary | ICD-10-CM

## 2013-11-20 ENCOUNTER — Ambulatory Visit: Payer: 59

## 2013-11-26 ENCOUNTER — Ambulatory Visit
Admission: RE | Admit: 2013-11-26 | Discharge: 2013-11-26 | Disposition: A | Discharge status: Home / Self Care | Payer: 59 | Source: Ambulatory Visit

## 2013-11-26 DIAGNOSIS — Z1231 Encounter for screening mammogram for malignant neoplasm of breast: Secondary | ICD-10-CM

## 2013-11-27 ENCOUNTER — Ambulatory Visit: Payer: 59

## 2013-12-02 ENCOUNTER — Ambulatory Visit: Payer: 59 | Attending: Orthopaedic Surgery

## 2013-12-02 DIAGNOSIS — M542 Cervicalgia: Secondary | ICD-10-CM | POA: Diagnosis not present

## 2013-12-02 DIAGNOSIS — IMO0001 Reserved for inherently not codable concepts without codable children: Secondary | ICD-10-CM | POA: Insufficient documentation

## 2013-12-02 DIAGNOSIS — R293 Abnormal posture: Secondary | ICD-10-CM | POA: Insufficient documentation

## 2013-12-02 DIAGNOSIS — M25569 Pain in unspecified knee: Secondary | ICD-10-CM | POA: Diagnosis not present

## 2013-12-15 ENCOUNTER — Ambulatory Visit: Payer: 59 | Admitting: Physical Therapy

## 2013-12-15 DIAGNOSIS — IMO0001 Reserved for inherently not codable concepts without codable children: Secondary | ICD-10-CM | POA: Diagnosis not present

## 2013-12-17 ENCOUNTER — Ambulatory Visit: Payer: 59 | Admitting: Physical Therapy

## 2013-12-17 DIAGNOSIS — IMO0001 Reserved for inherently not codable concepts without codable children: Secondary | ICD-10-CM | POA: Diagnosis not present

## 2013-12-19 ENCOUNTER — Encounter: Payer: Self-pay | Admitting: Family Medicine

## 2013-12-19 ENCOUNTER — Ambulatory Visit (INDEPENDENT_AMBULATORY_CARE_PROVIDER_SITE_OTHER): Payer: 59 | Admitting: Family Medicine

## 2013-12-19 VITALS — BP 120/86 | HR 67 | Temp 97.8°F | Ht 64.75 in | Wt 209.0 lb

## 2013-12-19 DIAGNOSIS — R232 Flushing: Secondary | ICD-10-CM

## 2013-12-19 DIAGNOSIS — R0789 Other chest pain: Secondary | ICD-10-CM

## 2013-12-19 DIAGNOSIS — R6889 Other general symptoms and signs: Secondary | ICD-10-CM

## 2013-12-19 DIAGNOSIS — N951 Menopausal and female climacteric states: Secondary | ICD-10-CM

## 2013-12-19 DIAGNOSIS — E669 Obesity, unspecified: Secondary | ICD-10-CM

## 2013-12-19 DIAGNOSIS — M25569 Pain in unspecified knee: Secondary | ICD-10-CM

## 2013-12-19 DIAGNOSIS — M25561 Pain in right knee: Secondary | ICD-10-CM | POA: Insufficient documentation

## 2013-12-19 DIAGNOSIS — R899 Unspecified abnormal finding in specimens from other organs, systems and tissues: Secondary | ICD-10-CM

## 2013-12-19 DIAGNOSIS — M25562 Pain in left knee: Secondary | ICD-10-CM

## 2013-12-19 DIAGNOSIS — Z7689 Persons encountering health services in other specified circumstances: Secondary | ICD-10-CM

## 2013-12-19 DIAGNOSIS — N926 Irregular menstruation, unspecified: Secondary | ICD-10-CM

## 2013-12-19 DIAGNOSIS — Z7189 Other specified counseling: Secondary | ICD-10-CM

## 2013-12-19 DIAGNOSIS — I10 Essential (primary) hypertension: Secondary | ICD-10-CM

## 2013-12-19 LAB — BASIC METABOLIC PANEL
BUN: 21 mg/dL (ref 6–23)
CHLORIDE: 100 meq/L (ref 96–112)
CO2: 31 mEq/L (ref 19–32)
CREATININE: 1.1 mg/dL (ref 0.4–1.2)
Calcium: 9.6 mg/dL (ref 8.4–10.5)
GFR: 56.58 mL/min — ABNORMAL LOW (ref >60.00)
Glucose, Bld: 101 mg/dL — ABNORMAL HIGH (ref 70–99)
POTASSIUM: 4.9 meq/L (ref 3.5–5.1)
SODIUM: 137 meq/L (ref 135–145)

## 2013-12-19 LAB — CBC WITH DIFFERENTIAL/PLATELET
Basophils Absolute: 0 10*3/uL (ref 0.0–0.1)
Basophils Relative: 0.5 % (ref 0.0–3.0)
Eosinophils Absolute: 0.2 10*3/uL (ref 0.0–0.7)
Eosinophils Relative: 2.4 % (ref 0.0–5.0)
HEMATOCRIT: 35.9 % — AB (ref 36.0–46.0)
Hemoglobin: 12.3 g/dL (ref 12.0–15.0)
Lymphocytes Relative: 35.9 % (ref 12.0–46.0)
Lymphs Abs: 2.3 10*3/uL (ref 0.7–4.0)
MCHC: 34.1 g/dL (ref 30.0–36.0)
MCV: 90 fl (ref 78.0–100.0)
MONO ABS: 0.5 10*3/uL (ref 0.1–1.0)
Monocytes Relative: 7.4 % (ref 3.0–12.0)
NEUTROS PCT: 53.8 % (ref 43.0–77.0)
Neutro Abs: 3.5 10*3/uL (ref 1.4–7.7)
Platelets: 335 10*3/uL (ref 150.0–400.0)
RBC: 3.99 Mil/uL (ref 3.87–5.11)
RDW: 13.6 % (ref 11.5–15.5)
WBC: 6.6 10*3/uL (ref 4.0–10.5)

## 2013-12-19 NOTE — Progress Notes (Signed)
Pre visit review using our clinic review tool, if applicable. No additional management support is needed unless otherwise documented below in the visit note. 

## 2013-12-19 NOTE — Patient Instructions (Signed)
-  We have ordered labs or studies at this visit. It can take up to 1-2 weeks for results and processing. We will contact you with instructions IF your results are abnormal. Normal results will be released to your [facility]. If you have not heard from [place] or can not find your results in [facility] in 2 weeks please contact our office.  -PLEASE SIGN UP FOR MYCHART TODAY   We recommend the following healthy lifestyle measures: - eat a healthy diet consisting of lots of vegetables, fruits, beans, nuts, seeds, healthy meats such as white chicken and fish and whole grains.  - avoid fried foods, fast food, processed foods, sodas, red meet and other fattening foods.  - get a least 150 minutes of aerobic exercise per week.   Follow up in: 2 months

## 2013-12-19 NOTE — Progress Notes (Addendum)
No chief complaint on file.   HPI:  Denise Reese is here to establish care. USed to see Dr. Luberta Mutter - but did not like her care there - felt like doctor did not pay attention to her. Last PCP and physical: seeing a gynecologist - Dr. Quincy Simmonds.  Has the following chronic problems and concerns today:  1) Chest heaviness: -on and off for > 5 years -reports has had an extensive work up for this with cardiology and pulmonology work up withPFT, Myocardial perfusion test, CXR, CT abd and pelvis and [place] for fibroids  -denies: SOB, DOE, pain with exercise, palpitations, cough, wheezinf -started exercising 1 week ago - biking 30 minutes per day  2)Bilateral knee pain and back pain: -seeing [place] ortho -just started on ultram and diclofenac for the pain -getting physical therapy - doing better -told has mild OA on MRI  3)Hot flashes and irr periods: -gyn feels maybe perimenopausal -following for now -they checked her hormones and ok  4) Elevated Triglyercides/mildly elevated wbc/mildly elevated cr: -on labs with gyn - was NON-fasting -needs repeat lab work  -diet is ok, working to increase exercise  5) Seasonal allergies: -uses saline and mucinex for this   Patient Active Problem List   Diagnosis Date Noted  . Irregular menstrual bleeding 12/19/2013  . Obesity 12/19/2013  . Hot flashes 12/19/2013  . Arthralgia of both knees 12/19/2013  . Acute appendicitis 07/22/2013  . Overweight 09/24/2011  . Atypical chest pain 08/05/2011    ROS negative for unless reported above: fevers, unintentional weight loss, hearing or vision loss, palpitations, struggling to breath, hemoptysis, melena, hematochezia, hematuria, falls, loc, si, thoughts of self harm  Past Medical History  Diagnosis Date  . Heart murmur     as a child  . Arthritis     bilateral knees, back  . Frequent headaches   . Allergy   . Elevated TSH 08/05/2011  . Hypertension 08/05/2011  . Acute appendicitis 07/22/2013    Past Surgical History  Procedure Laterality Date  . Tonsillectomy    . Tubal ligation    . Tubal ligation    . Forearm surgery      plates inserted then removed later  . Laparoscopic appendectomy N/A 07/22/2013    Procedure: APPENDECTOMY LAPAROSCOPIC;  Surgeon: Odis Hollingshead, MD;  Location: [facility];  Service: General;  Laterality: N/A;  . Wisdom tooth extraction      Family History  Problem Relation Age of Onset  . Alcohol abuse Father   . Osteoporosis Paternal Grandmother   . Cancer Maternal Grandfather     History   Social History  . Marital Status: Married    Spouse Name: N/A    Number of Children: N/A  . Years of Education: N/A   Social History Main Topics  . Smoking status: Never Smoker   . Smokeless tobacco: Never Used  . Alcohol Use: No  . Drug Use: No  . Sexual Activity: Yes    Partners: Male    Birth Control/ Protection: Surgical     Comment: Tubal   Other Topics Concern  . None   Social History Narrative   Works at [employer]   1 child 17y.o. Living at home, lives with husband- married 7 years ago.    Christian   Starting exercise program; diet is healthy             Current outpatient prescriptions:Cholecalciferol (VITAMIN D3) 1000 UNITS CAPS, Take by mouth daily., Disp: ,  Rfl: ;  diclofenac (VOLTAREN) 75 MG EC tablet, Take 75 mg by mouth 2 (two) times daily., Disp: , Rfl: ;  Misc Natural Products (OSTEO BI-FLEX JOINT SHIELD PO), Take 1 tablet by mouth daily., Disp: , Rfl: ;  Multiple Vitamin (MULITIVITAMIN WITH MINERALS) TABS, Take 1 tablet by mouth daily., Disp: , Rfl:  Omega-3 Fatty Acids (FISH OIL) 1200 MG CAPS, Take 1 capsule by mouth daily., Disp: , Rfl: ;  traMADol (ULTRAM) 50 MG tablet, Take by mouth every 6 (six) hours as needed., Disp: , Rfl: ;  dextromethorphan-guaiFENesin (MUCINEX DM) 30-600 MG per 12 hr tablet, Take 1 tablet by mouth every 12 (twelve) hours as needed for cough. , Disp: , Rfl:    EXAM:  Filed Vitals:   12/19/13 1426  BP: 120/86  Pulse: 67  Temp: 97.8 F (36.6 C)    Body mass index is 35.03 kg/(m^2).  GENERAL: vitals reviewed and listed above, alert, oriented, appears well hydrated and in no acute distress  HEENT: atraumatic, conjunttiva clear, no obvious abnormalities on inspection of external nose and ears  NECK: no obvious masses on inspection  LUNGS: clear to auscultation bilaterally, no wheezes, rales or rhonchi, good air movement  CV: HRRR, no peripheral edema  MS: moves all extremities without noticeable abnormality  PSYCH: pleasant and cooperative, no obvious depression or anxiety  ASSESSMENT AND PLAN:  Discussed the following assessment and plan:  Encounter to establish care -We reviewed the PMH, PSH, FH, SH, Meds and Allergies. -We provided refills for any medications we will prescribe as needed. -We addressed current concerns per orders and patient instructions. -We have asked for records for pertinent exams, studies, vaccines and notes from previous providers. -We have advised patient to follow up per instructions below.  Atypical chest pain -extensive evaluation in the past, reviewed -we discussed possible serious and likely etiologies, workup and treatment, treatment risks and return precautions -after this discussion, Aarion opted for observation, exercise, weight loss -follow up advised in 1-2 months -of course, we advised Tyreona  to return or notify a doctor immediately if symptoms worsen or persist or new concerns arise.  Arthralgia of both knees -seeing ortho -discussed risks of medicaitons, advise to use as little ultram as needed only for severe pain -advised if renal function impaired to stop NSAIDs and use acetaminophen and topical sports creams instead and follow up with ortho if persists -advised regular exercise and weight loss  Hot flashes Irregular menstrual bleeding -seeing gyn  Abnormal laboratory test  - Plan: Basic metabolic panel, CBC with Differential  Obesity -advised healthy diet and regular CV exercise    -Patient advised to return or notify a doctor immediately if symptoms worsen or persist or new concerns arise.  Patient Instructions  -We have ordered labs or studies at this visit. It can take up to 1-2 weeks for results and processing. We will contact you with instructions IF your results are abnormal. Normal results will be released to your [facility]. If you have not heard from [place] or can not find your results in [facility] in 2 weeks please contact our office.  -PLEASE SIGN UP FOR MYCHART TODAY   We recommend the following healthy lifestyle measures: - eat a healthy diet consisting of lots of vegetables, fruits, beans, nuts, seeds, healthy meats such as white chicken and fish and whole grains.  - avoid fried foods, fast food, processed foods, sodas, red meet and other fattening foods.  - get a least 150 minutes of aerobic exercise  per week.   Follow up in: 2 months      KIM, HANNAH R.

## 2013-12-22 ENCOUNTER — Ambulatory Visit: Payer: 59 | Admitting: Physical Therapy

## 2013-12-22 DIAGNOSIS — IMO0001 Reserved for inherently not codable concepts without codable children: Secondary | ICD-10-CM | POA: Diagnosis not present

## 2013-12-24 ENCOUNTER — Ambulatory Visit: Payer: 59 | Admitting: Physical Therapy

## 2013-12-24 ENCOUNTER — Encounter: Payer: Self-pay | Admitting: Family Medicine

## 2013-12-24 DIAGNOSIS — IMO0001 Reserved for inherently not codable concepts without codable children: Secondary | ICD-10-CM | POA: Diagnosis not present

## 2014-01-15 ENCOUNTER — Encounter: Payer: 59 | Admitting: Physical Therapy

## 2014-01-21 ENCOUNTER — Ambulatory Visit: Payer: 59 | Attending: Orthopaedic Surgery

## 2014-01-21 DIAGNOSIS — R293 Abnormal posture: Secondary | ICD-10-CM | POA: Insufficient documentation

## 2014-01-21 DIAGNOSIS — Z5189 Encounter for other specified aftercare: Secondary | ICD-10-CM | POA: Insufficient documentation

## 2014-01-21 DIAGNOSIS — M542 Cervicalgia: Secondary | ICD-10-CM | POA: Insufficient documentation

## 2014-01-21 DIAGNOSIS — M25561 Pain in right knee: Secondary | ICD-10-CM | POA: Insufficient documentation

## 2014-01-21 DIAGNOSIS — M25562 Pain in left knee: Secondary | ICD-10-CM | POA: Insufficient documentation

## 2014-01-26 ENCOUNTER — Ambulatory Visit (INDEPENDENT_AMBULATORY_CARE_PROVIDER_SITE_OTHER): Payer: 59 | Admitting: Family Medicine

## 2014-01-26 ENCOUNTER — Encounter: Payer: Self-pay | Admitting: Family Medicine

## 2014-01-26 VITALS — BP 100/80 | HR 70 | Temp 98.1°F | Ht 64.75 in | Wt 211.3 lb

## 2014-01-26 DIAGNOSIS — J329 Chronic sinusitis, unspecified: Secondary | ICD-10-CM

## 2014-01-26 DIAGNOSIS — J310 Chronic rhinitis: Secondary | ICD-10-CM

## 2014-01-26 NOTE — Patient Instructions (Signed)
-  AFRIN nasal spray for 4 days and then STOP  -start claritin daily for 1 month  -nasocort for 1 month  -tylenol or ibuprofen if needed per instructions  -humidifier at night - follow cleaning instructions  -follow up if worsening or not improving in 5-7 days

## 2014-01-26 NOTE — Progress Notes (Signed)
Pre visit review using our clinic review tool, if applicable. No additional management support is needed unless otherwise documented below in the visit note. 

## 2014-01-26 NOTE — Progress Notes (Addendum)
No chief complaint on file.   HPI:  Nasal congestion: -started: 1 week ago -symptoms:nasal congestion, sore throat, cough -denies:fever, SOB, NVD, tooth pain -has tried: nothing -sick contacts/travel/risks: denies flu exposure, tick exposure or or Ebola risks -Hx of: allergies -reports otherwise she is feeling much better, seeing gyn for irr menses and thinks perimenopausal   ROS: See pertinent positives and negatives per HPI.  Past Medical History  Diagnosis Date  . Heart murmur     as a child  . Arthritis     bilateral knees, back  . Frequent headaches   . Allergy   . Elevated TSH 08/05/2011  . Hypertension 08/05/2011  . Acute appendicitis 07/22/2013    Past Surgical History  Procedure Laterality Date  . Tonsillectomy    . Tubal ligation    . Tubal ligation    . Forearm surgery      plates inserted then removed later  . Laparoscopic appendectomy N/A 07/22/2013    Procedure: APPENDECTOMY LAPAROSCOPIC;  Surgeon: Odis Hollingshead, MD;  Location: [facility];  Service: General;  Laterality: N/A;  . Wisdom tooth extraction      Family History  Problem Relation Age of Onset  . Alcohol abuse Father   . Osteoporosis Paternal Grandmother   . Cancer Maternal Grandfather     History   Social History  . Marital Status: Married    Spouse Name: N/A    Number of Children: N/A  . Years of Education: N/A   Social History Main Topics  . Smoking status: Never Smoker   . Smokeless tobacco: Never Used  . Alcohol Use: No  . Drug Use: No  . Sexual Activity: Yes    Partners: Male    Birth Control/ Protection: Surgical     Comment: Tubal   Other Topics Concern  . None   Social History Narrative   Works at [employer]   1 child 17y.o. Living at home, lives with husband- married 7 years ago.    Christian   Starting exercise program; diet is healthy             Current outpatient prescriptions:Cholecalciferol (VITAMIN D3) 1000 UNITS CAPS, Take  by mouth daily., Disp: , Rfl: ;  dextromethorphan-guaiFENesin (MUCINEX DM) 30-600 MG per 12 hr tablet, Take 1 tablet by mouth every 12 (twelve) hours as needed for cough. , Disp: , Rfl: ;  Misc Natural Products (OSTEO BI-FLEX JOINT SHIELD PO), Take 1 tablet by mouth daily., Disp: , Rfl:  Multiple Vitamin (MULITIVITAMIN WITH MINERALS) TABS, Take 1 tablet by mouth daily., Disp: , Rfl: ;  Omega-3 Fatty Acids (FISH OIL) 1200 MG CAPS, Take 1 capsule by mouth daily., Disp: , Rfl:   EXAM:  Filed Vitals:   01/26/14 1601  BP: 100/80  Pulse: 70  Temp: 98.1 F (36.7 C)    Body mass index is 35.42 kg/(m^2).  GENERAL: vitals reviewed and listed above, alert, oriented, appears well hydrated and in no acute distress  HEENT: atraumatic, conjunttiva clear, no obvious abnormalities on inspection of external nose and ears, normal appearance of ear canals and TMs, clear nasal congestion, mild post oropharyngeal erythema with PND, no tonsillar edema or exudate, no sinus TTP  NECK: no obvious masses on inspection  LUNGS: clear to auscultation bilaterally, no wheezes, rales or rhonchi, good air movement  CV: HRRR, no peripheral edema  MS: moves all extremities without noticeable abnormality  PSYCH: pleasant and cooperative, no obvious depression or anxiety  ASSESSMENT AND  PLAN:  Discussed the following assessment and plan:  Rhinosinusitis  -given HPI and exam findings today, a serious infection or illness is unlikely. We discussed potential etiologies, with allergic rhinitis or VURI being most likely, and advised supportive care and monitoring. We discussed treatment side effects, likely course, antibiotic misuse, transmission, and signs of developing a serious illness. -of course, we advised to return or notify a doctor immediately if symptoms worsen or persist or new concerns arise.    Patient Instructions  -AFRIN nasal spray for 4 days and then STOP  -start claritin daily for 1  month  -nasocort for 1 month  -tylenol or ibuprofen if needed per instructions  -humidifier at night - follow cleaning instructions  -follow up if worsening or not improving in 5-7 days     KIM, HANNAH R.

## 2014-01-29 ENCOUNTER — Encounter: Payer: Self-pay | Admitting: Obstetrics and Gynecology

## 2014-02-02 ENCOUNTER — Encounter: Payer: Self-pay | Admitting: Family Medicine

## 2014-02-19 ENCOUNTER — Encounter: Payer: Self-pay | Admitting: Family Medicine

## 2014-02-19 ENCOUNTER — Ambulatory Visit (INDEPENDENT_AMBULATORY_CARE_PROVIDER_SITE_OTHER): Payer: 59 | Admitting: Family Medicine

## 2014-02-19 VITALS — BP 112/78 | HR 72 | Temp 97.3°F | Ht 64.75 in | Wt 218.3 lb

## 2014-02-19 DIAGNOSIS — H539 Unspecified visual disturbance: Secondary | ICD-10-CM

## 2014-02-19 DIAGNOSIS — R0789 Other chest pain: Secondary | ICD-10-CM

## 2014-02-19 DIAGNOSIS — R4189 Other symptoms and signs involving cognitive functions and awareness: Secondary | ICD-10-CM

## 2014-02-19 DIAGNOSIS — R479 Unspecified speech disturbances: Secondary | ICD-10-CM

## 2014-02-19 DIAGNOSIS — F411 Generalized anxiety disorder: Secondary | ICD-10-CM

## 2014-02-19 NOTE — Progress Notes (Signed)
Pre visit review using our clinic review tool, if applicable. No additional management support is needed unless otherwise documented below in the visit note. 

## 2014-02-19 NOTE — Progress Notes (Signed)
HPI:  Follow up:  1) Spells: -on and off for > 5 years -reports has had an extensive work up for this with cardiology and pulmonology work up with PFT, Myocardial perfusion test, CXR, CT abd and pelvis and [place] for fibroids  -denies: SOB, DOE, pain with exercise, palpitations, cough, wheezing -has noticed that she feels this more with caffiene and stress -she is dealing with a lot of stress at work -these spells come on and off, can resolve for weeks, then can come daily for awhile, sometimes comes on around the same time of the day - can last a few hours  -At initial visit she described the spells as mainly chest discomfort, but she reports today that she left out some of the details of the spells. Reports feels sensation of pressure in upper chest and throat and face, sometimes feels like it is difficult to talk during the spell and feels in a fog when these spells happen, occ nausea, L eye "glaring sensation" and difficulty focusing with this eye during spell -had a string of these spells daily for one week recently, no symptoms today  ROS: See pertinent positives and negatives per HPI.  Past Medical History  Diagnosis Date  . Heart murmur     as a child  . Arthritis     bilateral knees, back  . Frequent headaches   . Allergy   . Elevated TSH 08/05/2011  . Hypertension 08/05/2011  . Acute appendicitis 07/22/2013    Past Surgical History  Procedure Laterality Date  . Tonsillectomy    . Tubal ligation    . Tubal ligation    . Forearm surgery      plates inserted then removed later  . Laparoscopic appendectomy N/A 07/22/2013    Procedure: APPENDECTOMY LAPAROSCOPIC;  Surgeon: Odis Hollingshead, MD;  Location: [facility];  Service: General;  Laterality: N/A;  . Wisdom tooth extraction      Family History  Problem Relation Age of Onset  . Alcohol abuse Father   . Osteoporosis Paternal Grandmother   . Cancer Maternal Grandfather     History   Social History  . Marital Status:  Married    Spouse Name: N/A    Number of Children: N/A  . Years of Education: N/A   Social History Main Topics  . Smoking status: Never Smoker   . Smokeless tobacco: Never Used  . Alcohol Use: No  . Drug Use: No  . Sexual Activity:    Partners: Male    Birth Control/ Protection: Surgical     Comment: Tubal   Other Topics Concern  . None   Social History Narrative   Works at [employer]   1 child 17y.o. Living at home, lives with husband- married 7 years ago.    Christian   Starting exercise program; diet is healthy             Current outpatient prescriptions: acetaminophen (TYLENOL) 500 MG tablet, Take 500 mg by mouth every 6 (six) hours as needed., Disp: , Rfl: ;  dextromethorphan-guaiFENesin (MUCINEX DM) 30-600 MG per 12 hr tablet, Take 1 tablet by mouth every 12 (twelve) hours as needed for cough. , Disp: , Rfl:   EXAM:  Filed Vitals:   02/19/14 1456  BP: 112/78  Pulse: 72  Temp: 97.3 F (36.3 C)    Body mass index is 36.59 kg/(m^2).  GENERAL: vitals reviewed and listed above, alert, oriented, appears well hydrated and in no acute distress  HEENT: atraumatic, conjunttiva clear, PERRLA, visual acuity grossly intact, no obvious abnormalities on inspection of external nose and ears  NECK: no obvious masses on inspection, no bruit  LUNGS: clear to auscultation bilaterally, no wheezes, rales or rhonchi, good air movement  CV: HRRR, no peripheral edema  MS: moves all extremities without noticeable abnormality  NEURO: CN II-XII grossly intact, finger to nose normal  PSYCH: pleasant and cooperative, no obvious depression, somewhat ancious    ASSESSMENT AND PLAN:  Discussed the following assessment and plan:  Cognitive changes - Plan: Ambulatory referral to Neurology  Visual disturbance - Plan: Ambulatory referral to Neurology  Speech abnormality - Plan: Ambulatory referral to Neurology  Chest heaviness  Anxiety  state  -she reports intermittent spells of multiple vague symptoms occuring primarily at rest that seems to occur in clusters over the last 5 years -per reports she has undergone extensive CV and pulm evaluations -given the speech disturbance, visual disturbance and cog changes that occur with the spells she opted to see neurology and also have an optho eval -? Atypical migraine, anxiety, panic attacks vs other and if evaluations not eluding to dx or management of symptoms may do trial tx for anxiety -Patient advised to return or notify a doctor immediately if symptoms worsen or persist or new concerns arise.  Patient Instructions  We placed a referral for you as discussed to the neurologist. It usually takes about 1-2 weeks to process and schedule this referral. If you have not heard from [place] regarding this appointment in 2 weeks please contact our office.  opthalmology evaluation  We recommend the following healthy lifestyle measures: - eat a healthy diet consisting of lots of vegetables, fruits, beans, nuts, seeds, healthy meats such as white chicken and fish and whole grains.  - avoid fried foods, fast food, processed foods, sodas, red meet and other fattening foods.  - get a least 150 minutes of aerobic exercise per week.   Follow up with me after these evaluations or sooner if needed      Cadarius Nevares, [facility] R.

## 2014-02-19 NOTE — Patient Instructions (Signed)
We placed a referral for you as discussed to the neurologist. It usually takes about 1-2 weeks to process and schedule this referral. If you have not heard from [place] regarding this appointment in 2 weeks please contact our office.  opthalmology evaluation  We recommend the following healthy lifestyle measures: - eat a healthy diet consisting of lots of vegetables, fruits, beans, nuts, seeds, healthy meats such as white chicken and fish and whole grains.  - avoid fried foods, fast food, processed foods, sodas, red meet and other fattening foods.  - get a least 150 minutes of aerobic exercise per week.   Follow up with me after these evaluations or sooner if needed

## 2014-02-19 NOTE — Progress Notes (Deleted)
  HPI:  Follow up:    ROS: See pertinent positives and negatives per HPI.  Past Medical History  Diagnosis Date  . Heart murmur     as a child  . Arthritis     bilateral knees, back  . Frequent headaches   . Allergy   . Elevated TSH 08/05/2011  . Hypertension 08/05/2011  . Acute appendicitis 07/22/2013    Past Surgical History  Procedure Laterality Date  . Tonsillectomy    . Tubal ligation    . Tubal ligation    . Forearm surgery      plates inserted then removed later  . Laparoscopic appendectomy N/A 07/22/2013    Procedure: APPENDECTOMY LAPAROSCOPIC;  Surgeon: Odis Hollingshead, MD;  Location: [facility];  Service: General;  Laterality: N/A;  . Wisdom tooth extraction      Family History  Problem Relation Age of Onset  . Alcohol abuse Father   . Osteoporosis Paternal Grandmother   . Cancer Maternal Grandfather     History   Social History  . Marital Status: Married    Spouse Name: N/A    Number of Children: N/A  . Years of Education: N/A   Social History Main Topics  . Smoking status: Never Smoker   . Smokeless tobacco: Never Used  . Alcohol Use: No  . Drug Use: No  . Sexual Activity:    Partners: Male    Birth Control/ Protection: Surgical     Comment: Tubal   Other Topics Concern  . Not on file   Social History Narrative   Works at [employer]   1 child 17y.o. Living at home, lives with husband- married 7 years ago.    Christian   Starting exercise program; diet is healthy             Current outpatient prescriptions: Cholecalciferol (VITAMIN D3) 1000 UNITS CAPS, Take by mouth daily., Disp: , Rfl: ;  dextromethorphan-guaiFENesin (MUCINEX DM) 30-600 MG per 12 hr tablet, Take 1 tablet by mouth every 12 (twelve) hours as needed for cough. , Disp: , Rfl: ;  Misc Natural Products (OSTEO BI-FLEX JOINT SHIELD PO), Take 1 tablet by mouth daily., Disp: , Rfl:  Multiple Vitamin (MULITIVITAMIN WITH MINERALS) TABS, Take 1 tablet by  mouth daily., Disp: , Rfl: ;  Omega-3 Fatty Acids (FISH OIL) 1200 MG CAPS, Take 1 capsule by mouth daily., Disp: , Rfl:   EXAM:  There were no vitals filed for this visit.  There is no weight on file to calculate BMI.  GENERAL: vitals reviewed and listed above, alert, oriented, appears well hydrated and in no acute distress  HEENT: atraumatic, conjunttiva clear, no obvious abnormalities on inspection of external nose and ears  NECK: no obvious masses on inspection  LUNGS: clear to auscultation bilaterally, no wheezes, rales or rhonchi, good air movement  CV: HRRR, no peripheral edema  MS: moves all extremities without noticeable abnormality  PSYCH: pleasant and cooperative, no obvious depression or anxiety  ASSESSMENT AND PLAN:  Discussed the following assessment and plan:  No diagnosis found.  -Patient advised to return or notify a doctor immediately if symptoms worsen or persist or new concerns arise.  There are no Patient Instructions on file for this visit.   Colin Benton R.

## 2014-02-20 ENCOUNTER — Telehealth: Payer: Self-pay | Admitting: Obstetrics and Gynecology

## 2014-02-20 NOTE — Telephone Encounter (Signed)
Left msg upcoming appointment  time has changed.

## 2014-04-02 ENCOUNTER — Ambulatory Visit (INDEPENDENT_AMBULATORY_CARE_PROVIDER_SITE_OTHER): Payer: 59 | Admitting: Neurology

## 2014-04-02 ENCOUNTER — Encounter: Payer: Self-pay | Admitting: Neurology

## 2014-04-02 VITALS — BP 108/60 | HR 92 | Ht 65.0 in | Wt 223.0 lb

## 2014-04-02 DIAGNOSIS — R51 Headache: Secondary | ICD-10-CM

## 2014-04-02 DIAGNOSIS — R519 Headache, unspecified: Secondary | ICD-10-CM

## 2014-04-02 DIAGNOSIS — R404 Transient alteration of awareness: Secondary | ICD-10-CM

## 2014-04-02 NOTE — Patient Instructions (Signed)
1. Schedule MRI brain with and without contrast 2. Schedule routine EEG, plan for 48-hour EEG 3. As per [place] driving laws, for any episode of loss of awareness or consciousness, one should not drive until 6 months event-free 4. Follow-up after the test
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
## 2014-04-07 ENCOUNTER — Encounter: Payer: Self-pay | Admitting: Neurology

## 2014-04-08 ENCOUNTER — Ambulatory Visit (INDEPENDENT_AMBULATORY_CARE_PROVIDER_SITE_OTHER): Payer: 59 | Admitting: Neurology

## 2014-04-08 DIAGNOSIS — R519 Headache, unspecified: Secondary | ICD-10-CM

## 2014-04-08 DIAGNOSIS — R404 Transient alteration of awareness: Secondary | ICD-10-CM

## 2014-04-08 DIAGNOSIS — R51 Headache: Secondary | ICD-10-CM

## 2014-04-14 NOTE — Procedures (Signed)
ELECTROENCEPHALOGRAM REPORT  Date of Study: 04/08/2014  Patient's Name: Denise Reese MRN: [id number] Date of Birth: 1965-05-29  Referring Provider: Dr. Ellouise Newer  Clinical History: recurrent stereotyped episodes where she has chest and left arm heaviness, followed by left facial symptoms and inability to speak  Medications: Tylenol, Mucinex  Technical Summary: A multichannel digital EEG recording measured by the international 10-20 system with electrodes applied with paste and impedances below 5000 ohms performed in our laboratory with EKG monitoring in an awake and drowsy patient.  Hyperventilation and photic stimulation were performed.  The digital EEG was referentially recorded, reformatted, and digitally filtered in a variety of bipolar and referential montages for optimal display.    Description: The patient is awake and drowsy during the recording.  During maximal wakefulness, there is a symmetric, medium voltage 11-12 Hz posterior dominant rhythm that attenuates with eye opening.  There is occasional focal 4-5 Hz theta slowing seen over the left temporal region. During drowsiness, there is an increase in theta slowing of the background. Deeper stages of sleep were not seen. Hyperventilation and photic stimulation did not elicit any abnormalities.  There were no epileptiform discharges or electrographic seizures seen.    EKG lead was unremarkable.  Impression: This awake and asleep EEG is abnormal due to occasional focal slowing over the left temporal region.  Clinical Correlation of the above findings indicates focal cerebral dysfunction over the left temporal region suggestive of underlying structural or physiologic abnormality. If further clinical questions remain, prolonged EEG may be helpful.  Clinical correlation is advised.   Ellouise Newer, M.D.

## 2014-04-15 ENCOUNTER — Telehealth: Payer: Self-pay | Admitting: Neurology

## 2014-04-15 NOTE — Telephone Encounter (Signed)
Left VM, EEG did not show any seizure discharges. Would like to proceed with prolonged ambulatory EEG to try to capture episodes for classification. Instructed patient to call back for any questions.   Pls put her on cancellation list for EEG (she is not scheduled until March, I think). Thanks

## 2014-04-15 NOTE — Telephone Encounter (Signed)
Have you had a chance to look at her results?

## 2014-04-15 NOTE — Telephone Encounter (Signed)
Pt would like the results from the EEG [phone]

## 2014-04-15 NOTE — Telephone Encounter (Signed)
I spoke with Hinton Dyer, patient will be added to cancellation list.

## 2014-04-17 ENCOUNTER — Ambulatory Visit (HOSPITAL_COMMUNITY): Payer: 59

## 2014-04-20 ENCOUNTER — Ambulatory Visit (HOSPITAL_COMMUNITY)
Admission: RE | Admit: 2014-04-20 | Discharge: 2014-04-20 | Disposition: A | Discharge status: Home / Self Care | Payer: 59 | Source: Ambulatory Visit | Attending: Neurology | Admitting: Neurology

## 2014-04-20 DIAGNOSIS — R51 Headache: Secondary | ICD-10-CM | POA: Diagnosis not present

## 2014-04-20 DIAGNOSIS — R404 Transient alteration of awareness: Secondary | ICD-10-CM | POA: Diagnosis not present

## 2014-04-20 MED ORDER — GADOBENATE DIMEGLUMINE 529 MG/ML IV SOLN
20.0000 mL | Freq: Once | INTRAVENOUS | Status: AC | PRN
  Administered 2014-04-20: 20 mL via INTRAVENOUS

## 2014-04-30 ENCOUNTER — Telehealth: Payer: Self-pay | Admitting: Neurology

## 2014-04-30 NOTE — Telephone Encounter (Signed)
Called to offer to move appt from March to Feb 1. This sooner appt was good for her she will be here next Monday at 10:30am. Also gave her the instructions for an ambulatory EEG.

## 2014-05-04 ENCOUNTER — Ambulatory Visit (INDEPENDENT_AMBULATORY_CARE_PROVIDER_SITE_OTHER): Payer: 59 | Admitting: Neurology

## 2014-05-04 DIAGNOSIS — R404 Transient alteration of awareness: Secondary | ICD-10-CM

## 2014-05-04 DIAGNOSIS — R569 Unspecified convulsions: Secondary | ICD-10-CM

## 2014-05-13 NOTE — Procedures (Signed)
ELECTROENCEPHALOGRAM REPORT  Dates of Recording: 05/04/2014 to 05/06/2014  Patient's Name: Denise Reese MRN: [id number] Date of Birth: Sep 26, 1965  Referring Provider: Dr. Ellouise Newer  Procedure: 48-hour ambulatory EEG  History: This is a 49 year old woman with recurrent stereotyped episodes where she has chest and left arm heaviness, followed by left facial symptoms and inability to speak  Medications: Tylenol, Mucinex  Technical Summary: This is a 48-hour multichannel digital EEG recording measured by the international 10-20 system with electrodes applied with paste and impedances below 5000 ohms performed as portable with EKG monitoring.  The digital EEG was referentially recorded, reformatted, and digitally filtered in a variety of bipolar and referential montages for optimal display.    DESCRIPTION OF RECORDING: During maximal wakefulness, the background activity consisted of a symmetric 9.5 Hz posterior dominant rhythm which was reactive to eye opening.  There were no epileptiform discharges or focal slowing seen in wakefulness.  During the recording, the patient progresses through wakefulness, drowsiness, and Stage 2 sleep.  Again, there were no epileptiform discharges seen.  Events: On 2/1 at 1637 hours, patient reports chest heaviness, can't get good breath. Electrographically, there were no EEG or EKG changes seen.  On 2/1 at 1719 hours, she reports slight front center headache. Electrographically, there were no EEG or EKG changes seen.  On 2/1 at 1810 hours, she reports a bit hard to talk, face feeling a bit weird bottom left. Electrographically, there were no EEG or EKG changes seen.  On 2/1 at 1903 hours, she reports chest heaviness and going to thraot, more on left and middle, lasting up to 1948 hours. Electrographically, there were no EEG or EKG changes seen.  On 2/2 at 0605 hours, she reports arm muscles are weak and tired. Electrographically, there were no EEG or  EKG changes seen.  On 2/2 at 0827 hours, she reports left thigh hurts. Electrographically, there were no EEG or EKG changes seen.  On 2/2 at 1818 hours, she is talking and loses train of thought, head hurt slightly. Electrographically, there were no EEG or EKG changes seen.  On 2/3 at 0500 hours, she reports feeling very tired and sore, she was dropping things, very irritated.  There were no electrographic seizures seen.  EKG lead was unremarkable.  IMPRESSION: This 48-hour ambulatory EEG study is normal.    CLINICAL CORRELATION: Episodes noted sbove did not show any EEG change, indicating these are non-epileptic. They may relate to migraine. There were no epileptiform discharges or electrographic seizures in this study.   Ellouise Newer, M.D.

## 2014-05-21 ENCOUNTER — Ambulatory Visit (INDEPENDENT_AMBULATORY_CARE_PROVIDER_SITE_OTHER): Payer: 59 | Admitting: Family Medicine

## 2014-05-21 ENCOUNTER — Encounter: Payer: Self-pay | Admitting: Family Medicine

## 2014-05-21 VITALS — BP 100/70 | HR 77 | Temp 98.0°F | Ht 65.0 in | Wt 217.5 lb

## 2014-05-21 DIAGNOSIS — G43009 Migraine without aura, not intractable, without status migrainosus: Secondary | ICD-10-CM

## 2014-05-21 DIAGNOSIS — G43809 Other migraine, not intractable, without status migrainosus: Secondary | ICD-10-CM

## 2014-05-21 DIAGNOSIS — E669 Obesity, unspecified: Secondary | ICD-10-CM

## 2014-05-21 NOTE — Patient Instructions (Signed)
We recommend the following healthy lifestyle measures: - eat a healthy diet consisting of lots of vegetables, fruits, beans, nuts, seeds, healthy meats such as white chicken and fish and whole grains.  - avoid fried foods, fast food, processed foods, sodas, red meet and other fattening foods.  - get a least 150 minutes of aerobic exercise per week.   Follow up as needed and yearly

## 2014-05-21 NOTE — Progress Notes (Signed)
HPI:  Multiple vague symptoms: -for > five years - intermittent flares of facial and chest discomfort accompanied by "foggy" feeling, nausea, L eye glaring sensation, speech difficulty -lasts for hours to a day -triggered by poor sleep, caffeine and stress -has had extensive eval with cardiology, pulmonology, CT scans and most recently with opthomology and neurology -denies any change, maybe a little better with new glasses, exercise and eating well -she is getting some counseling through church for support but denies depression, anxiety  Obesity: -working on diet and exercise  ROS: See pertinent positives and negatives per HPI.  Past Medical History  Diagnosis Date  . Heart murmur     as a child  . Arthritis     bilateral knees, back  . Frequent headaches   . Allergy   . Elevated TSH 08/05/2011  . Acute appendicitis 07/22/2013    Past Surgical History  Procedure Laterality Date  . Tonsillectomy    . Tubal ligation    . Forearm surgery      plates inserted then removed later  . Laparoscopic appendectomy N/A 07/22/2013    Procedure: APPENDECTOMY LAPAROSCOPIC;  Surgeon: Odis Hollingshead, MD;  Location: [facility];  Service: General;  Laterality: N/A;  . Wisdom tooth extraction      Family History  Problem Relation Age of Onset  . Alcohol abuse Father   . Osteoporosis Paternal Grandmother   . Cancer Maternal Grandfather     History   Social History  . Marital Status: Married    Spouse Name: N/A  . Number of Children: N/A  . Years of Education: N/A   Social History Main Topics  . Smoking status: Never Smoker   . Smokeless tobacco: Never Used  . Alcohol Use: No  . Drug Use: No  . Sexual Activity:    Partners: Male    Birth Control/ Protection: Surgical     Comment: Tubal   Other Topics Concern  . None   Social History Narrative   Works at [employer]   1 child 17y.o. Living at home, lives with husband- married 7 years ago.    Christian   Starting exercise program; diet is healthy              Current outpatient prescriptions:  .  acetaminophen (TYLENOL) 500 MG tablet, Take 500 mg by mouth every 6 (six) hours as needed., Disp: , Rfl:  .  dextromethorphan-guaiFENesin (MUCINEX DM) 30-600 MG per 12 hr tablet, Take 1 tablet by mouth every 12 (twelve) hours as needed for cough. , Disp: , Rfl:  .  loratadine (CLARITIN) 10 MG tablet, Take 10 mg by mouth daily., Disp: , Rfl:   EXAM:  Filed Vitals:   05/21/14 1519  BP: 100/70  Pulse: 77  Temp: 98 F (36.7 C)    Body mass index is 36.19 kg/(m^2).  GENERAL: vitals reviewed and listed above, alert, oriented, appears well hydrated and in no acute distress  HEENT: atraumatic, conjunttiva clear, no obvious abnormalities on inspection of external nose and ears  NECK: no obvious masses on inspection  LUNGS: clear to auscultation bilaterally, no wheezes, rales or rhonchi, good air movement  CV: HRRR, no peripheral edema  MS: moves all extremities without noticeable abnormality  PSYCH: pleasant and cooperative, no obvious depression or anxiety  ASSESSMENT AND PLAN:  Discussed the following assessment and plan:  Obesity  Atypical migraine  -unsure of etiology for her symptoms but has had extensive evaluation and has been going  on for a long time -query atypical migraine - and she is following up with neurologist to discuss this -agreed to continue support of body, mind with exercise, healthy diet, counseling -cont to monitor for triggers and avoid -discussed anxiety and panic disorders, and she is going to continue her counseling through her church, though she doe snot feel she has any issues with anxiety or depression -Patient advised to return or notify a doctor immediately if symptoms worsen or persist or new concerns arise.  Patient Instructions  We recommend the following healthy lifestyle measures: - eat a healthy diet consisting of lots of  vegetables, fruits, beans, nuts, seeds, healthy meats such as white chicken and fish and whole grains.  - avoid fried foods, fast food, processed foods, sodas, red meet and other fattening foods.  - get a least 150 minutes of aerobic exercise per week.   Follow up as needed and yearly     Korrie Hofbauer, Jarrett Soho R.

## 2014-05-21 NOTE — Progress Notes (Signed)
Pre visit review using our clinic review tool, if applicable. No additional management support is needed unless otherwise documented below in the visit note. 

## 2014-06-02 ENCOUNTER — Ambulatory Visit (INDEPENDENT_AMBULATORY_CARE_PROVIDER_SITE_OTHER): Payer: 59 | Admitting: Neurology

## 2014-06-02 ENCOUNTER — Encounter: Payer: Self-pay | Admitting: Neurology

## 2014-06-02 VITALS — BP 90/62 | HR 78 | Resp 16 | Ht 65.0 in | Wt 219.0 lb

## 2014-06-02 DIAGNOSIS — R404 Transient alteration of awareness: Secondary | ICD-10-CM

## 2014-06-02 DIAGNOSIS — R51 Headache: Secondary | ICD-10-CM

## 2014-06-02 DIAGNOSIS — R519 Headache, unspecified: Secondary | ICD-10-CM

## 2014-06-02 MED ORDER — NORTRIPTYLINE HCL 10 MG PO CAPS: ORAL_CAPSULE | ORAL | Status: DC
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
## 2014-06-02 NOTE — Patient Instructions (Signed)
1. Start nortriptyline 10mg : Take 1 capsule at bedtime for 2 weeks, then increase to 2 caps at bedtime 2. Keep a calendar of your symptoms 3. Follow-up in 1 month, call our office for any problems

## 2014-06-07 DIAGNOSIS — R404 Transient alteration of awareness: Secondary | ICD-10-CM | POA: Insufficient documentation

## 2014-06-07 DIAGNOSIS — R519 Headache, unspecified: Secondary | ICD-10-CM | POA: Insufficient documentation

## 2014-06-07 DIAGNOSIS — R51 Headache: Secondary | ICD-10-CM

## 2014-06-22 ENCOUNTER — Other Ambulatory Visit: Payer: 59

## 2014-07-07 ENCOUNTER — Encounter: Payer: Self-pay | Admitting: Neurology

## 2014-07-07 ENCOUNTER — Ambulatory Visit (INDEPENDENT_AMBULATORY_CARE_PROVIDER_SITE_OTHER): Payer: 59 | Admitting: Neurology

## 2014-07-07 VITALS — BP 122/80 | HR 80 | Resp 16 | Ht 65.0 in | Wt 213.0 lb

## 2014-07-07 DIAGNOSIS — G43909 Migraine, unspecified, not intractable, without status migrainosus: Secondary | ICD-10-CM | POA: Diagnosis not present

## 2014-07-07 DIAGNOSIS — G43109 Migraine with aura, not intractable, without status migrainosus: Secondary | ICD-10-CM

## 2014-07-07 DIAGNOSIS — R51 Headache: Secondary | ICD-10-CM | POA: Diagnosis not present

## 2014-07-07 DIAGNOSIS — R519 Headache, unspecified: Secondary | ICD-10-CM

## 2014-07-07 HISTORY — DX: Migraine with aura, not intractable, without status migrainosus: G43.109
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
## 2014-07-07 NOTE — Patient Instructions (Addendum)
1. Continue nortriptyline 10mg : Take 2 capsules at bedtime 2. Keep a calendar of your symptoms, follow-up in 3 months

## 2014-08-06 ENCOUNTER — Telehealth: Payer: Self-pay | Admitting: *Deleted

## 2014-08-06 NOTE — Telephone Encounter (Signed)
Patient walked in complaining of back pain.  Patient states that she took a step and shooting pain went from her left leg to her left hip.  She does not have any numbness, tingling in her left leg.  She has not had any bladder problems.  An appointment was offered with Dr Maudie Mercury but patient has decided to go to [facility].

## 2014-10-06 ENCOUNTER — Ambulatory Visit: Payer: 59 | Admitting: Neurology

## 2014-11-27 ENCOUNTER — Encounter: Payer: Self-pay | Admitting: Obstetrics and Gynecology

## 2014-11-27 ENCOUNTER — Ambulatory Visit (INDEPENDENT_AMBULATORY_CARE_PROVIDER_SITE_OTHER): Payer: 59 | Admitting: Obstetrics and Gynecology

## 2014-11-27 ENCOUNTER — Ambulatory Visit: Payer: 59 | Admitting: Obstetrics and Gynecology

## 2014-11-27 VITALS — BP 114/68 | HR 70 | Resp 18 | Ht 65.0 in | Wt 217.2 lb

## 2014-11-27 DIAGNOSIS — Z0000 Encounter for general adult medical examination without abnormal findings: Secondary | ICD-10-CM

## 2014-11-27 DIAGNOSIS — Z01419 Encounter for gynecological examination (general) (routine) without abnormal findings: Secondary | ICD-10-CM

## 2014-11-27 DIAGNOSIS — N951 Menopausal and female climacteric states: Secondary | ICD-10-CM | POA: Diagnosis not present

## 2014-11-27 LAB — POCT URINALYSIS DIPSTICK
Bilirubin, UA: NEGATIVE
Blood, UA: NEGATIVE
Glucose, UA: NEGATIVE
KETONES UA: NEGATIVE
LEUKOCYTES UA: NEGATIVE
NITRITE UA: NEGATIVE
PH UA: 5
Protein, UA: NEGATIVE
Urobilinogen, UA: NEGATIVE

## 2014-11-27 MED ORDER — VENLAFAXINE HCL ER 37.5 MG PO CP24: 37.5000 mg | ORAL_CAPSULE | Freq: Every day | ORAL | Status: DC

## 2014-11-27 MED ORDER — FLUCONAZOLE 150 MG PO TABS: 150.0000 mg | ORAL_TABLET | Freq: Every day | ORAL | Status: DC

## 2014-11-27 NOTE — Patient Instructions (Signed)
EXERCISE AND DIET:  We recommended that you start or continue a regular exercise program for good health. Regular exercise means any activity that makes your heart beat faster and makes you sweat.  We recommend exercising at least 30 minutes per day at least 3 days a week, preferably 4 or 5.  We also recommend a diet low in fat and sugar.  Inactivity, poor dietary choices and obesity can cause diabetes, heart attack, stroke, and kidney damage, among others.    ALCOHOL AND SMOKING:  Women should limit their alcohol intake to no more than 7 drinks/beers/glasses of wine (combined, not each!) per week. Moderation of alcohol intake to this level decreases your risk of breast cancer and liver damage. And of course, no recreational drugs are part of a healthy lifestyle.  And absolutely no smoking or even second hand smoke. Most people know smoking can cause heart and lung diseases, but did you know it also contributes to weakening of your bones? Aging of your skin?  Yellowing of your teeth and nails?  CALCIUM AND VITAMIN D:  Adequate intake of calcium and Vitamin D are recommended.  The recommendations for exact amounts of these supplements seem to change often, but generally speaking 600 mg of calcium (either carbonate or citrate) and 800 units of Vitamin D per day seems prudent. Certain women may benefit from higher intake of Vitamin D.  If you are among these women, your doctor will have told you during your visit.    PAP SMEARS:  Pap smears, to check for cervical cancer or precancers,  have traditionally been done yearly, although recent scientific advances have shown that most women can have pap smears less often.  However, every woman still should have a physical exam from her gynecologist every year. It will include a breast check, inspection of the vulva and vagina to check for abnormal growths or skin changes, a visual exam of the cervix, and then an exam to evaluate the size and shape of the uterus and  ovaries.  And after 48 years of age, a rectal exam is indicated to check for rectal cancers. We will also provide age appropriate advice regarding health maintenance, like when you should have certain vaccines, screening for sexually transmitted diseases, bone density testing, colonoscopy, mammograms, etc.   MAMMOGRAMS:  All women over 40 years old should have a yearly mammogram. Many facilities now offer a "3D" mammogram, which may cost around $50 extra out of pocket. If possible,  we recommend you accept the option to have the 3D mammogram performed.  It both reduces the number of women who will be called back for extra views which then turn out to be normal, and it is better than the routine mammogram at detecting truly abnormal areas.    COLONOSCOPY:  Colonoscopy to screen for colon cancer is recommended for all women at age 50.  We know, you hate the idea of the prep.  We agree, BUT, having colon cancer and not knowing it is worse!!  Colon cancer so often starts as a polyp that can be seen and removed at colonscopy, which can quite literally save your life!  And if your first colonoscopy is normal and you have no family history of colon cancer, most women don't have to have it again for 10 years.  Once every ten years, you can do something that may end up saving your life, right?  We will be happy to help you get it scheduled when you are ready.    Be sure to check your insurance coverage so you understand how much it will cost.  It may be covered as a preventative service at no cost, but you should check your particular policy.     Venlafaxine extended-release capsules What is this medicine? VENLAFAXINE(VEN la fax een) is used to treat depression, anxiety and panic disorder. This medicine may be used for other purposes; ask your health care provider or pharmacist if you have questions. COMMON BRAND NAME(S): Effexor XR What should I tell my health care provider before I take this medicine? They need  to know if you have any of these conditions: -bleeding disorders -glaucoma -heart disease -high blood pressure -high cholesterol -kidney disease -liver disease -low levels of sodium in the blood -mania or bipolar disorder -seizures -suicidal thoughts, plans, or attempt; a previous suicide attempt by you or a family -take medicines that treat or prevent blood clots -thyroid disease -an unusual or allergic reaction to venlafaxine, desvenlafaxine, other medicines, foods, dyes, or preservatives -pregnant or trying to get pregnant -breast-feeding How should I use this medicine? Take this medicine by mouth with a full glass of water. Follow the directions on the prescription label. Do not cut, crush, or chew this medicine. Take it with food. If needed, the capsule may be carefully opened and the entire contents sprinkled on a spoonful of cool applesauce. Swallow the applesauce/pellet mixture right away without chewing and follow with a glass of water to ensure complete swallowing of the pellets. Try to take your medicine at about the same time each day. Do not take your medicine more often than directed. Do not stop taking this medicine suddenly except upon the advice of your doctor. Stopping this medicine too quickly may cause serious side effects or your condition may worsen. A special MedGuide will be given to you by the pharmacist with each prescription and refill. Be sure to read this information carefully each time. Talk to your pediatrician regarding the use of this medicine in children. Special care may be needed. Overdosage: If you think you have taken too much of this medicine contact a poison control center or emergency room at once. NOTE: This medicine is only for you. Do not share this medicine with others. What if I miss a dose? If you miss a dose, take it as soon as you can. If it is almost time for your next dose, take only that dose. Do not take double or extra doses. What may  interact with this medicine? Do not take this medicine with any of the following medications: -certain medicines for fungal infections like fluconazole, itraconazole, ketoconazole, posaconazole, voriconazole -cisapride -desvenlafaxine -dofetilide -dronedarone -duloxetine -levomilnacipran -linezolid -MAOIs like Carbex, Eldepryl, Marplan, Nardil, and Parnate -methylene blue (injected into a vein) -milnacipran -pimozide -thioridazine -ziprasidone This medicine may also interact with the following medications: -aspirin and aspirin-like medicines -certain medicines for depression, anxiety, or psychotic disturbances -certain medicines for migraine headaches like almotriptan, eletriptan, frovatriptan, naratriptan, rizatriptan, sumatriptan, zolmitriptan -certain medicines for sleep -certain medicines that treat or prevent blood clots like dalteparin, enoxaparin, warfarin -cimetidine -clozapine -diuretics -fentanyl -furazolidone -indinavir -isoniazid -lithium -metoprolol -NSAIDS, medicines for pain and inflammation, like ibuprofen or naproxen -other medicines that prolong the QT interval (cause an abnormal heart rhythm) -procarbazine -rasagiline -supplements like St. John's wort, kava kava, valerian -tramadol -tryptophan This list may not describe all possible interactions. Give your health care provider a list of all the medicines, herbs, non-prescription drugs, or dietary supplements you use. Also tell them if you  smoke, drink alcohol, or use illegal drugs. Some items may interact with your medicine. What should I watch for while using this medicine? Tell your doctor if your symptoms do not get better or if they get worse. Visit your doctor or health care professional for regular checks on your progress. Because it may take several weeks to see the full effects of this medicine, it is important to continue your treatment as prescribed by your doctor. Patients and their families  should watch out for new or worsening thoughts of suicide or depression. Also watch out for sudden changes in feelings such as feeling anxious, agitated, panicky, irritable, hostile, aggressive, impulsive, severely restless, overly excited and hyperactive, or not being able to sleep. If this happens, especially at the beginning of treatment or after a change in dose, call your health care professional. This medicine can cause an increase in blood pressure. Check with your doctor for instructions on monitoring your blood pressure while taking this medicine. You may get drowsy or dizzy. Do not drive, use machinery, or do anything that needs mental alertness until you know how this medicine affects you. Do not stand or sit up quickly, especially if you are an older patient. This reduces the risk of dizzy or fainting spells. Alcohol may interfere with the effect of this medicine. Avoid alcoholic drinks. Your mouth may get dry. Chewing sugarless gum, sucking hard candy and drinking plenty of water will help. Contact your doctor if the problem does not go away or is severe. What side effects may I notice from receiving this medicine? Side effects that you should report to your doctor or health care professional as soon as possible: -allergic reactions like skin rash, itching or hives, swelling of the face, lips, or tongue -breathing problems -changes in vision -hallucination, loss of contact with reality -seizures -suicidal thoughts or other mood changes -trouble passing urine or change in the amount of urine -unusual bleeding or bruising Side effects that usually do not require medical attention (report to your doctor or health care professional if they continue or are bothersome): -change in sex drive or performance -constipation -increased sweating -loss of appetite -nausea -tremors -weight loss This list may not describe all possible side effects. Call your doctor for medical advice about side  effects. You may report side effects to FDA at 1-800-FDA-1088. Where should I keep my medicine? Keep out of the reach of children. Store at a controlled temperature between 20 and 25 degrees C (68 degrees and 77 degrees F), in a dry place. Throw away any unused medicine after the expiration date. NOTE: This sheet is a summary. It may not cover all possible information. If you have questions about this medicine, talk to your doctor, pharmacist, or health care provider.  2015, [employer]vier/[employer] Standard. (2012-10-15 12:46:03)

## 2014-11-27 NOTE — Progress Notes (Addendum)
Patient ID: Denise Reese, female   DOB: [birth year], 49 y.o.   MRN: [id number] 49 y.o. G22P1011 Married Caucasian female here for annual exam.    Menses 04/27/14, 07/08/14, and 10/03/14. Menses are light and not painful. Huey Bienenstock for menopausal symptoms.  Now symptoms are increasing.   Some loss of urine with sneeze, cough.  No pad use.   PCP:  Lennie Odor, PA-C   Patient's last menstrual period was 10/03/2014 (exact date).          Sexually active: Yes.   female The current method of family planning is tubal ligation.    Exercising: Yes.    Treadmill and stretching. Smoker:  no  Health Maintenance: Pap:  10-24-12 Neg:Neg HR HPV History of abnormal Pap:  no MMG:  11-26-13 Density Cat.B/neg: Colonoscopy:  n/a BMD:   1990's  Result  Normal TDaP:  2013 Screening Labs:  Hb today: PCP, Urine today: Neg   reports that she has never smoked. She has never used smokeless tobacco. She reports that she does not drink alcohol or use illicit drugs.  Past Medical History  Diagnosis Date  . Heart murmur     as a child  . Arthritis     bilateral knees, back  . Frequent headaches   . Allergy   . Elevated TSH 08/05/2011  . Acute appendicitis 07/22/2013    Past Surgical History  Procedure Laterality Date  . Tonsillectomy    . Tubal ligation    . Forearm surgery      plates inserted then removed later  . Laparoscopic appendectomy N/A 07/22/2013    Procedure: APPENDECTOMY LAPAROSCOPIC;  Surgeon: Odis Hollingshead, MD;  Location: [facility];  Service: General;  Laterality: N/A;  . Wisdom tooth extraction      Current Outpatient Prescriptions  Medication Sig Dispense Refill  . acetaminophen (TYLENOL) 500 MG tablet Take 500 mg by mouth every 6 (six) hours as needed.    Marland Kitchen dextromethorphan-guaiFENesin (MUCINEX DM) 30-600 MG per 12 hr tablet Take 1 tablet by mouth every 12 (twelve) hours as needed for cough.     . Nutritional Supplements (ESTROVEN MAXIMUM STRENGTH PO) Take 1 tablet by mouth  daily.    . fluconazole (DIFLUCAN) 150 MG tablet Take 1 tablet (150 mg total) by mouth daily. Take 1 additional tablet if symptoms persist after 48 hours. 2 tablet 0  . venlafaxine XR (EFFEXOR XR) 37.5 MG 24 hr capsule Take 1 capsule (37.5 mg total) by mouth daily with breakfast. 30 capsule 2   No current facility-administered medications for this visit.    Family History  Problem Relation Age of Onset  . Alcohol abuse Father   . Osteoporosis Paternal Grandmother   . Cancer Maternal Grandfather     ROS:  Pertinent items are noted in HPI.  Otherwise, a comprehensive ROS was negative.  Exam:   BP 114/68 mmHg  Pulse 70  Resp 18  Ht 5\' 5"  (1.651 m)  Wt 217 lb 3.2 oz (98.521 kg)  BMI 36.14 kg/m2  LMP 10/03/2014 (Exact Date)    General appearance: alert, cooperative and appears stated age Head: Normocephalic, without obvious abnormality, atraumatic Neck: no adenopathy, supple, symmetrical, trachea midline and thyroid normal to inspection and palpation Lungs: clear to auscultation bilaterally Breasts: normal appearance, no masses or tenderness, Inspection negative, No nipple retraction or dimpling, No nipple discharge or bleeding, No axillary or supraclavicular adenopathy Heart: regular rate and rhythm Abdomen: soft, non-tender; bowel sounds normal; no masses,  no  organomegaly Extremities: extremities normal, atraumatic, no cyanosis or edema Skin: Skin color, texture, turgor normal. No rashes or lesions.  Medial thighs with erythematous patches. Lymph nodes: Cervical, supraclavicular, and axillary nodes normal. No abnormal inguinal nodes palpated Neurologic: Grossly normal  Pelvic: External genitalia:  no lesions.                Urethra:  normal appearing urethra with no masses, tenderness or lesions              Bartholins and Skenes: normal                 Vagina: normal appearing vagina with normal color and discharge, no lesions              Cervix: no lesions              Pap  taken: No. Bimanual Exam:  Uterus:  normal size, contour, position, consistency, mobility, non-tender              Adnexa: normal adnexa and no mass, fullness, tenderness              Rectovaginal: Yes.  .  Confirms.              Anus:  normal sphincter tone, no lesions  Chaperone was present for exam.  Assessment:   Well woman visit with normal exam. Perimenopausal female.  Increased menopausal symptoms. Mild GSI.  Rash, facial numbness with LoLoEstrin. Candida of medial thighs.  Plan: Yearly mammogram recommended after age 65.  Patient will schedule. Recommended self breast exam.  Pap and HR HPV as above. Discussed Calcium, Vitamin D, regular exercise program including cardiovascular and weight bearing exercise, Kegels. Labs performed.  No..   See orders. Refills given on medications.  Yes.  Will start Effexor XR 37.5 mg daily.  Discussed side effects - decreased libido, potential weight gain.  Discussed serotonin syndrome. Will do a recheck in 6 weeks. Call if no menses in 3 - 4 months.  Will consider progesterone challenge then.  Diflucan Rx.  See orders. Follow up annually and prn.      After visit summary provided.

## 2015-01-13 ENCOUNTER — Ambulatory Visit (INDEPENDENT_AMBULATORY_CARE_PROVIDER_SITE_OTHER): Payer: 59 | Admitting: Obstetrics and Gynecology

## 2015-01-13 ENCOUNTER — Encounter: Payer: Self-pay | Admitting: Obstetrics and Gynecology

## 2015-01-13 VITALS — BP 118/76 | HR 72 | Ht 65.0 in | Wt 214.4 lb

## 2015-01-13 DIAGNOSIS — N915 Oligomenorrhea, unspecified: Secondary | ICD-10-CM | POA: Diagnosis not present

## 2015-01-13 DIAGNOSIS — N951 Menopausal and female climacteric states: Secondary | ICD-10-CM

## 2015-01-13 MED ORDER — MEDROXYPROGESTERONE ACETATE 10 MG PO TABS: 10.0000 mg | ORAL_TABLET | Freq: Every day | ORAL | Status: DC

## 2015-01-13 MED ORDER — VENLAFAXINE HCL ER 75 MG PO CP24: 75.0000 mg | ORAL_CAPSULE | Freq: Every day | ORAL | Status: DC

## 2015-01-13 NOTE — Progress Notes (Signed)
Patient ID: Denise Reese, female   DOB: 08-08-1965, 49 y.o.   MRN: [id number] GYNECOLOGY  VISIT   HPI: 49 y.o.   Married  Caucasian  female   G2P1011 with Patient's last menstrual period was 10/03/2014 (exact date).   here for follow up after starting Effexor.    Taking Effexor XR 37.5 mg daily.  Still having a lot of hot flashes and night sweats for a little less than one year.  Since starting Effexor, is having less irritability and waking up less.   Menses spacing out further now.  No cycle for 3 months.   GYNECOLOGIC HISTORY: Patient's last menstrual period was 10/03/2014 (exact date). Contraception: Tubal Menopausal hormone therapy: none Last mammogram: 11-26-13 3D Density Cat.B/Neg/BiRads1: [facility] Last pap smear: 10-24-12 Neg:Neg HR HPV        OB History    Gravida Para Term Preterm AB TAB SAB Ectopic Multiple Living   2 1 1  1 1    1          Patient Active Problem List   Diagnosis Date Noted  . Complicated migraine 29/52/8413  . Awareness alteration, transient 06/07/2014  . Frequent headaches 06/07/2014  . Irregular menstrual bleeding 12/19/2013  . Obesity 12/19/2013  . Hot flashes 12/19/2013  . Arthralgia of both knees 12/19/2013  . Acute appendicitis 07/22/2013  . Overweight(278.02) 09/24/2011  . Atypical chest pain 08/05/2011    Past Medical History  Diagnosis Date  . Heart murmur     as a child  . Arthritis     bilateral knees, back  . Frequent headaches   . Allergy   . Elevated TSH 08/05/2011  . Acute appendicitis 07/22/2013    Past Surgical History  Procedure Laterality Date  . Tonsillectomy    . Tubal ligation    . Forearm surgery      plates inserted then removed later  . Laparoscopic appendectomy N/A 07/22/2013    Procedure: APPENDECTOMY LAPAROSCOPIC;  Surgeon: Odis Hollingshead, MD;  Location: [facility];  Service: General;  Laterality: N/A;  . Wisdom tooth extraction      Current Outpatient Prescriptions  Medication Sig Dispense  Refill  . acetaminophen (TYLENOL) 500 MG tablet Take 500 mg by mouth every 6 (six) hours as needed.    Marland Kitchen dextromethorphan-guaiFENesin (MUCINEX DM) 30-600 MG per 12 hr tablet Take 1 tablet by mouth every 12 (twelve) hours as needed for cough.     . Nutritional Supplements (ESTROVEN MAXIMUM STRENGTH PO) Take 1 tablet by mouth daily.    Marland Kitchen venlafaxine XR (EFFEXOR XR) 37.5 MG 24 hr capsule Take 1 capsule (37.5 mg total) by mouth daily with breakfast. 30 capsule 2   No current facility-administered medications for this visit.     ALLERGIES: Sulfa antibiotics; Codeine; and Lo loestrin fe  Family History  Problem Relation Age of Onset  . Alcohol abuse Father   . Osteoporosis Paternal Grandmother   . Cancer Maternal Grandfather     Social History   Social History  . Marital Status: Married    Spouse Name: N/A  . Number of Children: N/A  . Years of Education: N/A   Occupational History  . Not on file.   Social History Main Topics  . Smoking status: Never Smoker   . Smokeless tobacco: Never Used  . Alcohol Use: No  . Drug Use: No  . Sexual Activity:    Partners: Male    Birth Control/ Protection: Surgical     Comment:  Tubal   Other Topics Concern  . Not on file   Social History Narrative   Works at [employer]   1 child 17y.o. Living at home, lives with husband- married 7 years ago.    Christian   Starting exercise program; diet is healthy             ROS:  Pertinent items are noted in HPI.  PHYSICAL EXAMINATION:    BP 118/76 mmHg  Pulse 72  Ht 5\' 5"  (1.651 m)  Wt 214 lb 6.4 oz (97.251 kg)  BMI 35.68 kg/m2  LMP 10/03/2014 (Exact Date)    General appearance: alert, cooperative and appears stated age   ASSESSMENT  Oligomenorrhea. Menopausal hot flashes.  Somewhat improved on Effexor 37.5 mg daily. Rash and facial numbness with LoLoEstrin.   PLAN  Will do a progesterone challenge with Provera 10 mg po q day for 10 days. Discussed  rationale for use.  Call with response to Provera.  Then can increase Effexor to 75 mg daily.  See new Rx.  Follow up in 3 months.    An After Visit Summary was printed and given to the patient.  ___15___ minutes face to face time of which over 50% was spent in counseling.

## 2015-04-16 ENCOUNTER — Ambulatory Visit (INDEPENDENT_AMBULATORY_CARE_PROVIDER_SITE_OTHER): Payer: 59 | Admitting: Obstetrics and Gynecology

## 2015-04-16 ENCOUNTER — Encounter: Payer: Self-pay | Admitting: Obstetrics and Gynecology

## 2015-04-16 VITALS — BP 122/70 | HR 66 | Ht 65.0 in | Wt 208.4 lb

## 2015-04-16 DIAGNOSIS — N951 Menopausal and female climacteric states: Secondary | ICD-10-CM | POA: Diagnosis not present

## 2015-04-16 MED ORDER — VENLAFAXINE HCL ER 75 MG PO CP24: 75.0000 mg | ORAL_CAPSULE | Freq: Every day | ORAL | Status: DC

## 2015-04-16 NOTE — Progress Notes (Signed)
Patient ID: Denise Reese, female   DOB: 03-18-1966, 50 y.o.   MRN: [id number] GYNECOLOGY  VISIT   HPI: 50 y.o.   Married  Caucasian  female   G2P1011 with Patient's last menstrual period was 10/03/2014 (exact date).   here for 3 month follow up.  Patient states she is still having hot flashes day and night but they may be some better with Effexor.  She states she took Provera for 10 days but never had a cycle. Feels less irritated on the increased dosage of the Effexor.  Ears are less hot.  Still having hot flashes.  Quit taking the [place], and symptoms are not changed.  No menses since July 2016.   Had a small amount of spotting spontaneously 1 month ago.    Last took Provera and did not have a cycle.   Took LoLoEstrin in the recent past and had facial numbness and rash.   High cholesterol - triglycerides.  Will see her PCP in February for a fasting cholesterol.  GYNECOLOGIC HISTORY: Patient's last menstrual period was 10/03/2014 (exact date). Contraception:tubal Menopausal hormone therapy: none Last mammogram: 11-26-13 3D/Density Cat.B/Neg/BiRads1:[facility] Last pap smear: 10-24-12 Neg:Neg HR HPV        OB History    Gravida Para Term Preterm AB TAB SAB Ectopic Multiple Living   2 1 1  1 1    1          Patient Active Problem List   Diagnosis Date Noted  . Complicated migraine 99991111  . Awareness alteration, transient 06/07/2014  . Frequent headaches 06/07/2014  . Irregular menstrual bleeding 12/19/2013  . Obesity 12/19/2013  . Hot flashes 12/19/2013  . Arthralgia of both knees 12/19/2013  . Acute appendicitis 07/22/2013  . Overweight(278.02) 09/24/2011  . Atypical chest pain 08/05/2011    Past Medical History  Diagnosis Date  . Heart murmur     as a child  . Arthritis     bilateral knees, back  . Frequent headaches   . Allergy   . Elevated TSH 08/05/2011  . Acute appendicitis 07/22/2013    Past Surgical History  Procedure Laterality Date  .  Tonsillectomy    . Tubal ligation    . Forearm surgery      plates inserted then removed later  . Laparoscopic appendectomy N/A 07/22/2013    Procedure: APPENDECTOMY LAPAROSCOPIC;  Surgeon: Odis Hollingshead, MD;  Location: [facility];  Service: General;  Laterality: N/A;  . Wisdom tooth extraction      Current Outpatient Prescriptions  Medication Sig Dispense Refill  . acetaminophen (TYLENOL) 500 MG tablet Take 500 mg by mouth every 6 (six) hours as needed.    . traMADol (ULTRAM) 50 MG tablet Take 1 tablet by mouth every 6 (six) hours. Prn pain  0  . venlafaxine XR (EFFEXOR-XR) 75 MG 24 hr capsule Take 1 capsule (75 mg total) by mouth daily with breakfast. 30 capsule 5   No current facility-administered medications for this visit.     ALLERGIES: Sulfa antibiotics; Codeine; and Lo loestrin fe  Family History  Problem Relation Age of Onset  . Alcohol abuse Father   . Osteoporosis Paternal Grandmother   . Cancer Maternal Grandfather     Social History   Social History  . Marital Status: Married    Spouse Name: N/A  . Number of Children: N/A  . Years of Education: N/A   Occupational History  . Not on file.   Social History Main Topics  .  Smoking status: Never Smoker   . Smokeless tobacco: Never Used  . Alcohol Use: No  . Drug Use: No  . Sexual Activity:    Partners: Male    Birth Control/ Protection: Surgical     Comment: Tubal   Other Topics Concern  . Not on file   Social History Narrative   Works at [employer]   1 child 17y.o. Living at home, lives with husband- married 7 years ago.    Christian   Starting exercise program; diet is healthy             ROS:  Pertinent items are noted in HPI.  PHYSICAL EXAMINATION:    BP 122/70 mmHg  Pulse 66  Ht 5\' 5"  (1.651 m)  Wt 208 lb 6.4 oz (94.53 kg)  BMI 34.68 kg/m2  LMP 10/03/2014 (Exact Date)    General appearance: alert, cooperative and appears stated  age  ASSESSMENT  Perimenopausal female. Menopausal symptoms.  Improved with Effexor XR 75 mg. Obesity.  Hypertriglyceridemia.  PLAN  Check FSH, estradiol, TSH. Counseled regarding treatment of menopausal symptoms and treatment options including HRT. As patient had a reaction to LoLoEstrin, she may have a reaction to estrogen/progesterone in the form of HRT.  She will continue with the Effexor XR for now.  See PCP for cholesterol/TG recheck.  Follow up for annual exam.   An After Visit Summary was printed and given to the patient.  _15_____ minutes face to face time of which over 50% was spent in counseling.

## 2015-04-17 ENCOUNTER — Encounter: Payer: Self-pay | Admitting: Obstetrics and Gynecology

## 2015-04-17 LAB — ESTRADIOL: Estradiol: 16.3 pg/mL

## 2015-04-17 LAB — FOLLICLE STIMULATING HORMONE: FSH: 58 m[IU]/mL

## 2015-04-17 LAB — THYROID PANEL WITH TSH
FREE THYROXINE INDEX: 2 (ref 1.4–3.8)
T3 Uptake: 34 % (ref 22–35)
T4 TOTAL: 5.9 ug/dL (ref 4.5–12.0)
TSH: 1.655 u[IU]/mL (ref 0.350–4.500)

## 2015-05-24 ENCOUNTER — Encounter: Payer: Self-pay | Admitting: Family Medicine

## 2015-05-24 ENCOUNTER — Ambulatory Visit (INDEPENDENT_AMBULATORY_CARE_PROVIDER_SITE_OTHER): Payer: 59 | Admitting: Family Medicine

## 2015-05-24 VITALS — BP 112/74 | HR 63 | Temp 97.6°F | Ht 64.75 in | Wt 206.0 lb

## 2015-05-24 DIAGNOSIS — H60391 Other infective otitis externa, right ear: Secondary | ICD-10-CM | POA: Diagnosis not present

## 2015-05-24 DIAGNOSIS — J302 Other seasonal allergic rhinitis: Secondary | ICD-10-CM

## 2015-05-24 DIAGNOSIS — R232 Flushing: Secondary | ICD-10-CM

## 2015-05-24 DIAGNOSIS — N951 Menopausal and female climacteric states: Secondary | ICD-10-CM | POA: Diagnosis not present

## 2015-05-24 DIAGNOSIS — Z0000 Encounter for general adult medical examination without abnormal findings: Secondary | ICD-10-CM | POA: Diagnosis not present

## 2015-05-24 DIAGNOSIS — E669 Obesity, unspecified: Secondary | ICD-10-CM

## 2015-05-24 LAB — BASIC METABOLIC PANEL
BUN: 17 mg/dL (ref 6–23)
CHLORIDE: 102 meq/L (ref 96–112)
CO2: 29 meq/L (ref 19–32)
CREATININE: 0.79 mg/dL (ref 0.40–1.20)
Calcium: 9.8 mg/dL (ref 8.4–10.5)
GFR: 82.41 mL/min (ref >60.00)
Glucose, Bld: 95 mg/dL (ref 70–99)
POTASSIUM: 4.7 meq/L (ref 3.5–5.1)
SODIUM: 138 meq/L (ref 135–145)

## 2015-05-24 LAB — LIPID PANEL
Cholesterol: 201 mg/dL — ABNORMAL HIGH (ref 0–200)
HDL: 83.6 mg/dL (ref >39.00)
LDL Cholesterol: 101 mg/dL — ABNORMAL HIGH (ref 0–99)
NONHDL: 117.53
Total CHOL/HDL Ratio: 2
Triglycerides: 81 mg/dL (ref 0.0–149.0)
VLDL: 16.2 mg/dL (ref 0.0–40.0)

## 2015-05-24 LAB — HEMOGLOBIN A1C: Hgb A1c MFr Bld: 5.6 % (ref 4.6–6.5)

## 2015-05-24 MED ORDER — OFLOXACIN 0.3 % OT SOLN: 5.0000 [drp] | Freq: Every day | OTIC | Status: DC

## 2015-05-24 NOTE — Patient Instructions (Signed)
BEFORE YOU LEAVE: -labs -schedule physical yearly  For the allergies: -try changing claritin to zyrtec -consider seeing allergist  For the ear: -ear drops for Right ear for 5 days, follow up if any issues persist  -We have ordered labs or studies at this visit. It can take up to 1-2 weeks for results and processing. We will contact you with instructions IF your results are abnormal. Normal results will be released to your [facility]. If you have not heard from [place] or can not find your results in [facility] in 2 weeks please contact our office.  We recommend the following healthy lifestyle measures: - eat a healthy whole foods diet consisting of regular small meals composed of vegetables, fruits, beans, nuts, seeds, healthy meats such as white chicken and fish and whole grains.  - avoid sweets, white starchy foods, fried foods, fast food, processed foods, sodas, red meet and other fattening foods.  - get a least 150-300 minutes of aerobic exercise per week.

## 2015-05-24 NOTE — Progress Notes (Signed)
Pre visit review using our clinic review tool, if applicable. No additional management support is needed unless otherwise documented below in the visit note. 

## 2015-05-24 NOTE — Progress Notes (Signed)
HPI:  Denise Reese is a 50 yo whom I have not seen in 1 year here for a CPE. She sees Dr. Quincy Simmonds for her women's health and a neurologist for atypical migraines. She reports she has been doing well. With a healthy diet and regular exercise, she is happy to report has lost some weight, migraines and unusual symptoms have resolved and her hot flashes are improved. She does struggle with seasonal allergies which have been worse for the last 1 week. She used a curette in the right ear and has some pain in this. No fevers, chills, drainage from the ear, sinus pain or shortness of breath.  -Diet: variety of foods, balance and well rounded  -Exercise: regular exercise  -Taking folic acid, vitamin D or calcium: no  -Diabetes and Dyslipidemia Screening: FASTING for labs today  -Hx of HTN: no  -Vaccines: UTD  -pap history: reports done with gyn  -wants STI testing (Hep C if born [birth year]): no  -FH breast, colon or ovarian ca: see FH Last mammogram:reports done, sees gyn for women's health Last colon cancer screening: n/a   -Alcohol, Tobacco, drug use: see social history  Review of Systems - no fevers, unintentional weight loss, vision loss, hearing loss, chest pain, sob, hemoptysis, melena, hematochezia, hematuria, genital discharge, changing or concerning skin lesions, bleeding, bruising, loc, thoughts of self harm or SI  Past Medical History  Diagnosis Date  . Heart murmur     as a child  . Arthritis     bilateral knees, back  . Frequent headaches   . Allergy   . Elevated TSH 08/05/2011  . Acute appendicitis 07/22/2013  . Complicated migraine XX123456    Past Surgical History  Procedure Laterality Date  . Tonsillectomy    . Tubal ligation    . Forearm surgery      plates inserted then removed later  . Laparoscopic appendectomy N/A 07/22/2013    Procedure: APPENDECTOMY LAPAROSCOPIC;  Surgeon: Odis Hollingshead, MD;  Location: [facility];  Service: General;  Laterality: N/A;  .  Wisdom tooth extraction      Family History  Problem Relation Age of Onset  . Alcohol abuse Father   . Osteoporosis Paternal Grandmother   . Cancer Maternal Grandfather     Social History   Social History  . Marital Status: Married    Spouse Name: N/A  . Number of Children: N/A  . Years of Education: N/A   Social History Main Topics  . Smoking status: Never Smoker   . Smokeless tobacco: Never Used  . Alcohol Use: No  . Drug Use: No  . Sexual Activity:    Partners: Male    Birth Control/ Protection: Surgical     Comment: Tubal   Other Topics Concern  . None   Social History Narrative   Works at [employer]   1 child 17y.o. Living at home, lives with husband- married 7 years ago.    Christian   Starting exercise program; diet is healthy              Current outpatient prescriptions:  .  acetaminophen (TYLENOL) 500 MG tablet, Take 500 mg by mouth every 6 (six) hours as needed., Disp: , Rfl:  .  BIOTIN 5000 PO, Take by mouth daily., Disp: , Rfl:  .  Calcium Carb-Cholecalciferol (CALCIUM + D3 PO), Take by mouth., Disp: , Rfl:  .  dextromethorphan-guaiFENesin (MUCINEX DM) 30-600 MG 12hr tablet, Take 1 tablet by mouth  2 (two) times daily., Disp: , Rfl:  .  Glucosamine-Chondroitin (OSTEO BI-FLEX REGULAR STRENGTH PO), Take by mouth., Disp: , Rfl:  .  loratadine (CLARITIN) 10 MG tablet, Take 10 mg by mouth daily., Disp: , Rfl:  .  Multiple Vitamin (MULTIVITAMIN) capsule, Take 1 capsule by mouth daily., Disp: , Rfl:  .  Omega-3 Fatty Acids (FISH OIL PO), Take by mouth. Mega red, Disp: , Rfl:  .  triamcinolone (NASACORT ALLERGY 24HR) 55 MCG/ACT AERO nasal inhaler, Place 2 sprays into the nose daily., Disp: , Rfl:  .  venlafaxine XR (EFFEXOR-XR) 75 MG 24 hr capsule, Take 1 capsule (75 mg total) by mouth daily with breakfast., Disp: 30 capsule, Rfl: 6 .  ofloxacin (FLOXIN) 0.3 % otic solution, Place 5 drops into the right ear daily., Disp: 5 mL, Rfl:  0  EXAM:  Filed Vitals:   05/24/15 0804  BP: 112/74  Pulse: 63  Temp: 97.6 F (36.4 C)   Body mass index is 34.53 kg/(m^2).  GENERAL: vitals reviewed and listed below, alert, oriented, appears well hydrated and in no acute distress  HEENT: head atraumatic, PERRLA, normal appearance of eyes, ears, nose and mouth. moist mucus membranes, normal appearance of ear canals and TMs except for small scab with erythematous papule R ext ear canal, no mastoid TTP, clear nasal congestion, mild post oropharyngeal erythema with PND, no tonsillar edema or exudate, no sinus TTP  NECK: supple, no masses or lymphadenopathy  LUNGS: clear to auscultation bilaterally, no rales, rhonchi or wheeze  CV: HRRR, no peripheral edema or cyanosis, normal pedal pulses  BREAST: declined  ABDOMEN: bowel sounds normal, soft, non tender to palpation, no masses, no rebound or guarding  GU: declined  SKIN: no rash or abnormal lesions - declined full skin exam  MS: normal gait, moves all extremities normally  NEURO: CN II-XII grossly intact, normal muscle strength and sensation to light touch on extremities  PSYCH: normal affect, pleasant and cooperative  ASSESSMENT AND PLAN:  Discussed the following assessment and plan:  Visit for preventive health examination - Plan: Lipid Panel, Basic metabolic panel, Hemoglobin A1c  Obesity  Hot flashes  Seasonal allergies  Otitis, externa, infective, right   -Discussed and advised all [place] preventive services health task force level A and B recommendations for age, sex and risks.  -Advised at least 150 minutes of exercise per week and a healthy diet low in saturated fats and sweets and consisting of fresh fruits and vegetables, lean meats such as fish and white chicken and whole grains.  -labs, studies and vaccines per orders this encounter  Orders Placed This Encounter  Procedures  . Lipid Panel  . Basic metabolic panel  . Hemoglobin A1c    Patient  advised to return to clinic immediately if symptoms worsen or persist or new concerns.  Patient Instructions  BEFORE YOU LEAVE: -labs -schedule physical yearly  For the allergies: -try changing claritin to zyrtec -consider seeing allergist  For the ear: -ear drops for Right ear for 5 days, follow up if any issues persist  -We have ordered labs or studies at this visit. It can take up to 1-2 weeks for results and processing. We will contact you with instructions IF your results are abnormal. Normal results will be released to your [facility]. If you have not heard from [place] or can not find your results in [facility] in 2 weeks please contact our office.  We recommend the following healthy lifestyle measures: - eat a healthy whole  foods diet consisting of regular small meals composed of vegetables, fruits, beans, nuts, seeds, healthy meats such as white chicken and fish and whole grains.  - avoid sweets, white starchy foods, fried foods, fast food, processed foods, sodas, red meet and other fattening foods.  - get a least 150-300 minutes of aerobic exercise per week.          No Follow-up on file.  Colin Benton R.

## 2015-05-31 ENCOUNTER — Telehealth: Payer: Self-pay | Admitting: Family Medicine

## 2015-05-31 NOTE — Telephone Encounter (Signed)
Please help her schedule follow-up, tomorrow if she can, to recheck her ear.

## 2015-05-31 NOTE — Telephone Encounter (Signed)
Patient stated the ear drops are not helping at all, please advise

## 2015-05-31 NOTE — Telephone Encounter (Signed)
Unable to leave a message due to voicemail being full. 

## 2015-06-01 ENCOUNTER — Ambulatory Visit (INDEPENDENT_AMBULATORY_CARE_PROVIDER_SITE_OTHER): Payer: 59 | Admitting: Family Medicine

## 2015-06-01 ENCOUNTER — Encounter: Payer: Self-pay | Admitting: Family Medicine

## 2015-06-01 VITALS — BP 112/70 | HR 74 | Temp 98.2°F | Ht 64.75 in | Wt 211.2 lb

## 2015-06-01 DIAGNOSIS — H6983 Other specified disorders of Eustachian tube, bilateral: Secondary | ICD-10-CM

## 2015-06-01 DIAGNOSIS — J329 Chronic sinusitis, unspecified: Secondary | ICD-10-CM | POA: Diagnosis not present

## 2015-06-01 DIAGNOSIS — J0110 Acute frontal sinusitis, unspecified: Secondary | ICD-10-CM

## 2015-06-01 MED ORDER — AMOXICILLIN-POT CLAVULANATE 875-125 MG PO TABS: 1.0000 | ORAL_TABLET | Freq: Two times a day (BID) | ORAL | Status: DC

## 2015-06-01 NOTE — Telephone Encounter (Signed)
Pt has been scheduled for today to recheck ear per Dr Maudie Mercury.

## 2015-06-01 NOTE — Telephone Encounter (Signed)
Unable to leave a message due to voicemail being full. 

## 2015-06-01 NOTE — Telephone Encounter (Signed)
Patient was seen today by Dr.Kim.

## 2015-06-01 NOTE — Addendum Note (Signed)
Addended by: Lucretia Kern on: 06/01/2015 04:53 PM   Modules accepted: Orders

## 2015-06-01 NOTE — Patient Instructions (Signed)
Take the Augmentin as instructed.  Continue the Flonase and Zyrtec.  Add Afrin nasal spray twice daily for 4-5 days then stop. This medication cannot be used long-term or could cause some chronic nasal congestion.  Follow-up as needed and keep your evaluation with the allergist as planned.

## 2015-06-01 NOTE — Progress Notes (Signed)
Pre visit review using our clinic review tool, if applicable. No additional management support is needed unless otherwise documented below in the visit note. 

## 2015-06-01 NOTE — Progress Notes (Signed)
HPI:  Denise Reese is a very pleasant 50 year old here for an acute visit for persistent ear pain. She has a history of chronic nasal congestion and postnasal drip and will be seeing an allergist for evaluation in a few weeks. However, her symptoms have worsened over the last week with thickened postnasal drip, sinus pain and pressure, bilateral ear pain and a sore throat. She is using Flonase daily along with Zyrtec. She did 7 days of topical antibiotics in the right ear for recent otitis externa secondary to trauma of the ear canal. Denies fevers, shortness of breath, wheezing, drainage from the ear or hearing loss.  ROS: See pertinent positives and negatives per HPI.  Past Medical History  Diagnosis Date  . Heart murmur     as a child  . Arthritis     bilateral knees, back  . Frequent headaches   . Allergy   . Elevated TSH 08/05/2011  . Acute appendicitis 07/22/2013  . Complicated migraine [id number]    Past Surgical History  Procedure Laterality Date  . Tonsillectomy    . Tubal ligation    . Forearm surgery      plates inserted then removed later  . Laparoscopic appendectomy N/A 07/22/2013    Procedure: APPENDECTOMY LAPAROSCOPIC;  Surgeon: Odis Hollingshead, MD;  Location: [facility];  Service: General;  Laterality: N/A;  . Wisdom tooth extraction      Family History  Problem Relation Age of Onset  . Alcohol abuse Father   . Osteoporosis Paternal Grandmother   . Cancer Maternal Grandfather     Social History   Social History  . Marital Status: Married    Spouse Name: N/A  . Number of Children: N/A  . Years of Education: N/A   Social History Main Topics  . Smoking status: Never Smoker   . Smokeless tobacco: Never Used  . Alcohol Use: No  . Drug Use: No  . Sexual Activity:    Partners: Male    Birth Control/ Protection: Surgical     Comment: Tubal   Other Topics Concern  . None   Social History Narrative   Works at [employer]   1 child  50y.o. Living at home, lives with husband- married 7 years ago.    Christian   Starting exercise program; diet is healthy              Current outpatient prescriptions:  .  acetaminophen (TYLENOL) 500 MG tablet, Take 500 mg by mouth every 6 (six) hours as needed., Disp: , Rfl:  .  BIOTIN 5000 PO, Take by mouth daily., Disp: , Rfl:  .  Calcium Carb-Cholecalciferol (CALCIUM + D3 PO), Take by mouth., Disp: , Rfl:  .  cetirizine (ZYRTEC) 10 MG tablet, Take 10 mg by mouth daily., Disp: , Rfl:  .  dextromethorphan-guaiFENesin (MUCINEX DM) 30-600 MG 12hr tablet, Take 1 tablet by mouth 2 (two) times daily., Disp: , Rfl:  .  Glucosamine-Chondroitin (OSTEO BI-FLEX REGULAR STRENGTH PO), Take by mouth., Disp: , Rfl:  .  Multiple Vitamin (MULTIVITAMIN) capsule, Take 1 capsule by mouth daily., Disp: , Rfl:  .  ofloxacin (FLOXIN) 0.3 % otic solution, Place 5 drops into the right ear daily., Disp: 5 mL, Rfl: 0 .  Omega-3 Fatty Acids (FISH OIL PO), Take by mouth. Mega red, Disp: , Rfl:  .  triamcinolone (NASACORT ALLERGY 24HR) 55 MCG/ACT AERO nasal inhaler, Place 2 sprays into the nose daily., Disp: , Rfl:  .  venlafaxine XR (  EFFEXOR-XR) 75 MG 24 hr capsule, Take 1 capsule (75 mg total) by mouth daily with breakfast., Disp: 30 capsule, Rfl: 6 .  amoxicillin-clavulanate (AUGMENTIN) 875-125 MG tablet, Take 1 tablet by mouth 2 (two) times daily., Disp: 20 tablet, Rfl: 0  EXAM:  Filed Vitals:   06/01/15 1620  BP: 112/70  Pulse: 74  Temp: 98.2 F (36.8 C)    Body mass index is 35.4 kg/(m^2).  GENERAL: vitals reviewed and listed above, alert, oriented, appears well hydrated and in no acute distress  HEENT: atraumatic, conjunttiva clear, no obvious abnormalities on inspection of external nose and ears, normal appearance of ear canals and TMs normal except for clear effusions bilaterally, thick nasal congestion, mild post oropharyngeal erythema with PND, no tonsillar edema or exudate, no sinus  TTP  NECK: no obvious masses on inspection  LUNGS: clear to auscultation bilaterally, no wheezes, rales or rhonchi, good air movement  CV: HRRR, no peripheral edema  MS: moves all extremities without noticeable abnormality  PSYCH: pleasant and cooperative, no obvious depression or anxiety  ASSESSMENT AND PLAN:  Discussed the following assessment and plan:  Acute frontal sinusitis, recurrence not specified  Rhinosinusitis  Eustachian tube dysfunction, bilateral  -Suspect she has developed some eustachian tube dysfunction and a sinusitis from the chronic rhinosinusitis. Will treat the sinus infection with Augmentin. Advised the addition of Afrin nasal spray for 5 days to help with the eustachian tube dysfunction, warned of side effects with prolonged use. Discussed other treatments for allergies including Singulair, but will hold off on changing course until evaluated by the allergist. The otitis externa has resolved. -Patient advised to return or notify a doctor immediately if symptoms worsen or persist or new concerns arise.  Patient Instructions  Take the Augmentin as instructed.  Continue the Flonase and Zyrtec.  Add Afrin nasal spray twice daily for 4-5 days then stop. This medication cannot be used long-term or could cause some chronic nasal congestion.  Follow-up as needed and keep your evaluation with the allergist as planned.     Colin Benton R.

## 2015-06-15 ENCOUNTER — Other Ambulatory Visit: Payer: Self-pay | Admitting: Obstetrics and Gynecology

## 2015-06-16 NOTE — Telephone Encounter (Signed)
Medication refill request: Effexor 75 mg  Last AEX:  11/27/14 with BS Next AEX: 12/02/2015 with BS Last MMG (if hormonal medication request): 11/26/2013 Bi-Rads 1: Negative Refill authorized: #[id number] rfs?

## 2015-11-22 ENCOUNTER — Ambulatory Visit (INDEPENDENT_AMBULATORY_CARE_PROVIDER_SITE_OTHER): Payer: 59 | Admitting: Family Medicine

## 2015-11-22 ENCOUNTER — Encounter: Payer: Self-pay | Admitting: Family Medicine

## 2015-11-22 VITALS — BP 120/82 | HR 76 | Temp 98.0°F | Ht 64.75 in | Wt 234.7 lb

## 2015-11-22 DIAGNOSIS — E669 Obesity, unspecified: Secondary | ICD-10-CM

## 2015-11-22 DIAGNOSIS — R140 Abdominal distension (gaseous): Secondary | ICD-10-CM | POA: Diagnosis not present

## 2015-11-22 DIAGNOSIS — R600 Localized edema: Secondary | ICD-10-CM

## 2015-11-22 LAB — CBC
HCT: 40.3 % (ref 36.0–46.0)
Hemoglobin: 13.6 g/dL (ref 12.0–15.0)
MCHC: 33.8 g/dL (ref 30.0–36.0)
MCV: 90.4 fl (ref 78.0–100.0)
PLATELETS: 351 10*3/uL (ref 150.0–400.0)
RBC: 4.46 Mil/uL (ref 3.87–5.11)
RDW: 13.6 % (ref 11.5–15.5)
WBC: 7.6 10*3/uL (ref 4.0–10.5)

## 2015-11-22 LAB — BASIC METABOLIC PANEL
BUN: 21 mg/dL (ref 6–23)
CALCIUM: 9.7 mg/dL (ref 8.4–10.5)
CHLORIDE: 101 meq/L (ref 96–112)
CO2: 27 mEq/L (ref 19–32)
CREATININE: 0.88 mg/dL (ref 0.40–1.20)
GFR: 72.61 mL/min (ref >60.00)
Glucose, Bld: 96 mg/dL (ref 70–99)
POTASSIUM: 4 meq/L (ref 3.5–5.1)
SODIUM: 139 meq/L (ref 135–145)

## 2015-11-22 LAB — BRAIN NATRIURETIC PEPTIDE: Pro B Natriuretic peptide (BNP): 16 pg/mL (ref 0.0–100.0)

## 2015-11-22 LAB — TSH: TSH: 4.16 u[IU]/mL (ref 0.35–4.50)

## 2015-11-22 NOTE — Progress Notes (Signed)
Pre visit review using our clinic review tool, if applicable. No additional management support is needed unless otherwise documented below in the visit note. 

## 2015-11-22 NOTE — Progress Notes (Signed)
HPI:  Acute visit for:  LE Edema: -started about 1 month ago -notices most in ankles -feels like some swelling in hands at times too -had stomach bug 1 month ago -occ feels bloated -denies: CP, SOB, DOE, fevers, Malaise, weight loss, palpitations, jt pain, tick bites, malaise -has been eating atkins diet - does not seem healthy and feels has gained weight  ROS: See pertinent positives and negatives per HPI.  Past Medical History:  Diagnosis Date  . Acute appendicitis 07/22/2013  . Allergy   . Arthritis    bilateral knees, back  . Complicated migraine XX123456  . Elevated TSH 08/05/2011  . Frequent headaches   . Heart murmur    as a child    Past Surgical History:  Procedure Laterality Date  . FOREARM SURGERY     plates inserted then removed later  . LAPAROSCOPIC APPENDECTOMY N/A 07/22/2013   Procedure: APPENDECTOMY LAPAROSCOPIC;  Surgeon: Odis Hollingshead, MD;  Location: [facility];  Service: General;  Laterality: N/A;  . TONSILLECTOMY    . TUBAL LIGATION    . WISDOM TOOTH EXTRACTION      Family History  Problem Relation Age of Onset  . Alcohol abuse Father   . Osteoporosis Paternal Grandmother   . Cancer Maternal Grandfather     Social History   Social History  . Marital status: Married    Spouse name: N/A  . Number of children: N/A  . Years of education: N/A   Social History Main Topics  . Smoking status: Never Smoker  . Smokeless tobacco: Never Used  . Alcohol use No  . Drug use: No  . Sexual activity: Yes    Partners: Male    Birth control/ protection: Surgical     Comment: Tubal   Other Topics Concern  . None   Social History Narrative   Works at [employer]   1 child 17y.o. Living at home, lives with husband- married 7 years ago.    Christian   Starting exercise program; diet is healthy              Current Outpatient Prescriptions:  .  acetaminophen (TYLENOL) 500 MG tablet, Take 500 mg by mouth every 6 (six)  hours as needed., Disp: , Rfl:  .  BIOTIN 5000 PO, Take by mouth daily., Disp: , Rfl:  .  Calcium Carb-Cholecalciferol (CALCIUM + D3 PO), Take by mouth., Disp: , Rfl:  .  Glucosamine-Chondroitin (OSTEO BI-FLEX REGULAR STRENGTH PO), Take by mouth., Disp: , Rfl:  .  Multiple Vitamin (MULTIVITAMIN) capsule, Take 1 capsule by mouth daily., Disp: , Rfl:  .  Omega-3 Fatty Acids (FISH OIL PO), Take by mouth. Mega red, Disp: , Rfl:   EXAM:  Vitals:   11/22/15 1428  BP: 120/82  Pulse: 76  Temp: 98 F (36.7 C)    Body mass index is 39.36 kg/m.  GENERAL: vitals reviewed and listed above, alert, oriented, appears well hydrated and in no acute distress  HEENT: atraumatic, conjunttiva clear, no obvious abnormalities on inspection of external nose and ears  NECK: no obvious masses on inspection  LUNGS: clear to auscultation bilaterally, no wheezes, rales or rhonchi, good air movement  CV: HRRR, tr bilat ankle peripheral edema  ABD: soft, NTTP  MS: moves all extremities without noticeable abnormality  PSYCH: pleasant and cooperative, no obvious depression or anxiety  ASSESSMENT AND PLAN:  Discussed the following assessment and plan:  Bilateral leg edema - Plan: CBC (no diff), Basic  metabolic panel, Brain Natriuretic Peptide, TSH  Obesity  -labs -compression -healthy diet (advised Mediterranean diet), regular exercise advised -advised gyn exam given vague complaints occ bloating - she agrees to schedule as reports is due for pap -follow up 1 month -Patient advised to return or notify a doctor immediately if symptoms worsen or persist or new concerns arise.  Patient Instructions  BEFORE YOU LEAVE: -follow up: 1 month -labs  We have ordered labs or studies at this visit. It can take up to 1-2 weeks for results and processing. IF results require follow up or explanation, we will call you with instructions. Clinically stable results will be released to your [facility]. If you have not  heard from [place] or cannot find your results in [facility] in 2 weeks please contact our office at [phone].  If you are not yet signed up for [facility], please consider signing up.  Compression socks  Eat a healthy diet, low in sodium.  We recommend the following healthy lifestyle: 1) Small portions - eat off of salad plate instead of dinner plate 2) Eat a healthy clean diet with avoidance of (less then 1 serving per week) processed foods, sweetened drinks, white starches, red meat, fast foods and sweets and consisting of: * 5-9 servings per day of fresh or frozen fruits and vegetables (not corn or potatoes, not dried or canned) *nuts and seeds, beans *olives and olive oil *small portions of lean meats such as fish and white chicken  *small portions of whole grains 3)Get at least 150 minutes of sweaty aerobic exercise per week 4)reduce stress - counseling, meditation, relaxation to balance other aspects of your life     Colin Benton R., DO

## 2015-11-22 NOTE — Patient Instructions (Signed)
BEFORE YOU LEAVE: -follow up: 1 month -labs  We have ordered labs or studies at this visit. It can take up to 1-2 weeks for results and processing. IF results require follow up or explanation, we will call you with instructions. Clinically stable results will be released to your [facility]. If you have not heard from [place] or cannot find your results in [facility] in 2 weeks please contact our office at [phone].  If you are not yet signed up for [facility], please consider signing up.  Compression socks  Eat a healthy diet, low in sodium.  We recommend the following healthy lifestyle: 1) Small portions - eat off of salad plate instead of dinner plate 2) Eat a healthy clean diet with avoidance of (less then 1 serving per week) processed foods, sweetened drinks, white starches, red meat, fast foods and sweets and consisting of: * 5-9 servings per day of fresh or frozen fruits and vegetables (not corn or potatoes, not dried or canned) *nuts and seeds, beans *olives and olive oil *small portions of lean meats such as fish and white chicken  *small portions of whole grains 3)Get at least 150 minutes of sweaty aerobic exercise per week 4)reduce stress - counseling, meditation, relaxation to balance other aspects of your life

## 2015-11-23 ENCOUNTER — Encounter: Payer: Self-pay | Admitting: Family Medicine

## 2015-11-25 NOTE — Telephone Encounter (Signed)
Denise Reese,   Please let her know would advise the plan we went over at her appointment:  The healthy, high antioxidant Mediterranean diet we discussed at her appt. Regular exercise Gyn exam And, follow up in 1 month to see how things are going. Thanks.

## 2015-12-02 ENCOUNTER — Ambulatory Visit: Payer: 59 | Admitting: Obstetrics and Gynecology

## 2015-12-09 ENCOUNTER — Ambulatory Visit (INDEPENDENT_AMBULATORY_CARE_PROVIDER_SITE_OTHER): Payer: 59 | Admitting: Obstetrics and Gynecology

## 2015-12-09 ENCOUNTER — Encounter: Payer: Self-pay | Admitting: Obstetrics and Gynecology

## 2015-12-09 VITALS — BP 112/68 | HR 70 | Resp 20 | Ht 64.5 in | Wt 238.2 lb

## 2015-12-09 DIAGNOSIS — Z01419 Encounter for gynecological examination (general) (routine) without abnormal findings: Secondary | ICD-10-CM

## 2015-12-09 DIAGNOSIS — N951 Menopausal and female climacteric states: Secondary | ICD-10-CM | POA: Diagnosis not present

## 2015-12-09 NOTE — Patient Instructions (Signed)

## 2015-12-09 NOTE — Progress Notes (Signed)
50 y.o. G71P1011 Married Caucasian female here for annual exam.    2 menses in the last year - November 14, 2015, April 2017, July 2016.  August bleeding was light for 5 days.  Has never gone a full 12 months without having a menstrual period.  Prior FSH 58, estradiol 16.3.   Hot flashes are manageable. Stopped Effexor due to weight gain.   Starting exercising again.  Has several exercise machines.   Considering Weight Watchers.  Leaking urine with cough or sneeze.  Up twice a night to empty bladder.   Had normal blood work with PCP.   PCP:   Colin Benton, DO  Patient's last menstrual period was 11/14/2015 (exact date).     Period Pattern: (!) Irregular     Sexually active: Yes.   female The current method of family planning is tubal ligation.    Exercising: Yes, walks on treadmill 4-5 days/week Smoker:  no  Health Maintenance: Pap:  10-24-12 Neg:Neg HR HPV History of abnormal Pap:  no MMG:  11-26-13 3D/Density B/neg/BiRads1:[facility] Colonoscopy:  n/a BMD:   1990's  Result  normal TDaP:  2013 Gardasil:   N/A   Screening Labs:  Hb today: PCP, Urine today: NEG   reports that she has never smoked. She has never used smokeless tobacco. She reports that she does not drink alcohol or use drugs.  Past Medical History:  Diagnosis Date  . Acute appendicitis 07/22/2013  . Allergy   . Arthritis    bilateral knees, back  . Complicated migraine XX123456  . Elevated TSH 08/05/2011  . Frequent headaches   . Heart murmur    as a child    Past Surgical History:  Procedure Laterality Date  . FOREARM SURGERY     plates inserted then removed later  . LAPAROSCOPIC APPENDECTOMY N/A 07/22/2013   Procedure: APPENDECTOMY LAPAROSCOPIC;  Surgeon: Odis Hollingshead, MD;  Location: [facility];  Service: General;  Laterality: N/A;  . TONSILLECTOMY    . TUBAL LIGATION    . WISDOM TOOTH EXTRACTION      Current Outpatient Prescriptions  Medication Sig Dispense Refill  . acetaminophen  (TYLENOL) 500 MG tablet Take 500 mg by mouth every 6 (six) hours as needed.    Marland Kitchen BIOTIN 5000 PO Take by mouth daily.    . Calcium Carb-Cholecalciferol (CALCIUM + D3 PO) Take by mouth.    . Glucosamine-Chondroitin (OSTEO BI-FLEX REGULAR STRENGTH PO) Take by mouth.    . Multiple Vitamin (MULTIVITAMIN) capsule Take 1 capsule by mouth daily.    . Omega-3 Fatty Acids (FISH OIL PO) Take by mouth. Mega red     No current facility-administered medications for this visit.     Family History  Problem Relation Age of Onset  . Alcohol abuse Father   . Osteoporosis Paternal Grandmother   . Cancer Maternal Grandfather     ROS:  Pertinent items are noted in HPI.  Otherwise, a comprehensive ROS was negative.  Exam:   BP 112/68 (BP Location: Right Arm, Patient Position: Sitting, Cuff Size: Large)   Pulse 70   Resp 20   Ht 5' 4.5" (1.638 m)   Wt 238 lb 3.2 oz (108 kg)   LMP 11/14/2015 (Exact Date)   BMI 40.26 kg/m     General appearance: alert, cooperative and appears stated age Head: Normocephalic, without obvious abnormality, atraumatic Neck: no adenopathy, supple, symmetrical, trachea midline and thyroid normal to inspection and palpation Lungs: clear to auscultation bilaterally Breasts: normal  appearance, no masses or tenderness, No nipple retraction or dimpling, No nipple discharge or bleeding, No axillary or supraclavicular adenopathy Heart: regular rate and rhythm Abdomen: soft, non-tender; no masses, no organomegaly Extremities: extremities normal, atraumatic, no cyanosis or edema Skin: Skin color, texture, turgor normal. No rashes or lesions Lymph nodes: Cervical, supraclavicular, and axillary nodes normal. No abnormal inguinal nodes palpated Neurologic: Grossly normal  Pelvic: External genitalia:  no lesions              Urethra:  normal appearing urethra with no masses, tenderness or lesions              Bartholins and Skenes: normal                 Vagina: normal appearing  vagina with normal color and discharge, no lesions              Cervix: no lesions              Pap taken: Yes.   Bimanual Exam:  Uterus:  normal size, contour, position, consistency, mobility, non-tender.  Good Kegel.               Adnexa: no mass, fullness, tenderness              Rectal exam: Yes.  .  Confirms.              Anus:  normal sphincter tone, no lesions  Chaperone was present for exam.  Assessment:   Well woman visit with normal exam. Perimenopausal female. Genuine stress incontinence.  Obesity.   Plan: Yearly mammogram recommended after age 57.  Patient will schedule at [facility].  Recommended self breast exam.  Pap and HR HPV as above. Guidelines for Calcium, Vitamin D, regular exercise program including cardiovascular and weight bearing exercise. Will check [place] and estradiol today to be sure we are diagnosing correctly any postmenopausal bleeding. May need future progesterone challenges.  Discussed weight loss through diet and exercise. I encouraged gradual weight loss, Weight Watchers and a trainer to give good instruction.  Kegels reviewed.  Briefly mentioned pelvic floor therapy and surgery for stress incontinence.  Follow up annually and prn.       After visit summary provided.

## 2015-12-10 LAB — ESTRADIOL: ESTRADIOL: 17 pg/mL

## 2015-12-10 LAB — FOLLICLE STIMULATING HORMONE: FSH: 52.6 m[IU]/mL

## 2015-12-11 ENCOUNTER — Encounter: Payer: Self-pay | Admitting: Obstetrics and Gynecology

## 2015-12-11 ENCOUNTER — Other Ambulatory Visit: Payer: Self-pay | Admitting: Obstetrics and Gynecology

## 2015-12-11 DIAGNOSIS — N950 Postmenopausal bleeding: Secondary | ICD-10-CM

## 2015-12-13 ENCOUNTER — Other Ambulatory Visit: Payer: Self-pay | Admitting: Obstetrics and Gynecology

## 2015-12-13 DIAGNOSIS — Z1231 Encounter for screening mammogram for malignant neoplasm of breast: Secondary | ICD-10-CM

## 2015-12-13 LAB — IPS PAP TEST WITH HPV

## 2015-12-16 ENCOUNTER — Ambulatory Visit (INDEPENDENT_AMBULATORY_CARE_PROVIDER_SITE_OTHER): Payer: 59

## 2015-12-16 ENCOUNTER — Other Ambulatory Visit: Payer: Self-pay | Admitting: Obstetrics and Gynecology

## 2015-12-16 ENCOUNTER — Encounter: Payer: Self-pay | Admitting: Obstetrics and Gynecology

## 2015-12-16 ENCOUNTER — Ambulatory Visit (INDEPENDENT_AMBULATORY_CARE_PROVIDER_SITE_OTHER): Payer: 59 | Admitting: Obstetrics and Gynecology

## 2015-12-16 DIAGNOSIS — N950 Postmenopausal bleeding: Secondary | ICD-10-CM

## 2015-12-16 NOTE — Patient Instructions (Signed)

## 2015-12-16 NOTE — Progress Notes (Signed)
Patient ID: Denise Reese, female   DOB: 11/05/1965, 50 y.o.   MRN: [id number] GYNECOLOGY  VISIT   HPI: 50 y.o.   Married  Caucasian  female   G2P1011 with Patient's last menstrual period was 11/14/2015 (exact date).   here for pelvic ultrasound for postmenopausal bleeding.    Has never gone a full year without vaginal bleeding. Menses:  July 2016, April 2017, August 2017.  FSH 58 and estradiol 16.3 - 04/16/15. [place] 52.6 and estradiol 17 - 12/09/15.  GYNECOLOGIC HISTORY: Patient's last menstrual period was 11/14/2015 (exact date). Contraception:  Tubal ligation Menopausal hormone therapy:  none Last mammogram:  11-26-13 3D/ Density B/Neg/BiRads1:[facility]--Appt.12-20-15 Last pap smear:   12-09-15 Neg:Neg HR HPV        OB History    Gravida Para Term Preterm AB Living   2 1 1   1 1    SAB TAB Ectopic Multiple Live Births     1               Patient Active Problem List   Diagnosis Date Noted  . Irregular menstrual bleeding 12/19/2013  . Obesity 12/19/2013  . Hot flashes 12/19/2013    Past Medical History:  Diagnosis Date  . Acute appendicitis 07/22/2013  . Allergy   . Arthritis    bilateral knees, back  . Complicated migraine [id number]  . Elevated TSH 08/05/2011  . Frequent headaches   . Heart murmur    as a child    Past Surgical History:  Procedure Laterality Date  . FOREARM SURGERY     plates inserted then removed later  . LAPAROSCOPIC APPENDECTOMY N/A 07/22/2013   Procedure: APPENDECTOMY LAPAROSCOPIC;  Surgeon: Odis Hollingshead, MD;  Location: [facility];  Service: General;  Laterality: N/A;  . TONSILLECTOMY    . TUBAL LIGATION    . WISDOM TOOTH EXTRACTION      Current Outpatient Prescriptions  Medication Sig Dispense Refill  . acetaminophen (TYLENOL) 500 MG tablet Take 500 mg by mouth every 6 (six) hours as needed.    Marland Kitchen BIOTIN 5000 PO Take by mouth daily.    . Calcium Carb-Cholecalciferol (CALCIUM + D3 PO) Take by mouth.    . Glucosamine-Chondroitin (OSTEO  BI-FLEX REGULAR STRENGTH PO) Take by mouth.    . Multiple Vitamin (MULTIVITAMIN) capsule Take 1 capsule by mouth daily.    . Omega-3 Fatty Acids (FISH OIL PO) Take by mouth. Mega red     No current facility-administered medications for this visit.      ALLERGIES: Sulfa antibiotics; Codeine; and Lo loestrin fe [norethin-eth estrad-fe biphas]  Family History  Problem Relation Age of Onset  . Alcohol abuse Father   . Osteoporosis Paternal Grandmother   . Cancer Maternal Grandfather     Social History   Social History  . Marital status: Married    Spouse name: N/A  . Number of children: N/A  . Years of education: N/A   Occupational History  . Not on file.   Social History Main Topics  . Smoking status: Never Smoker  . Smokeless tobacco: Never Used  . Alcohol use No  . Drug use: No  . Sexual activity: Yes    Partners: Male    Birth control/ protection: Surgical     Comment: Tubal   Other Topics Concern  . Not on file   Social History Narrative   Works at [employer]   1 child 17y.o. Living at home, lives with husband- married  7 years ago.    Christian   Starting exercise program; diet is healthy             ROS:  Pertinent items are noted in HPI.  PHYSICAL EXAMINATION:    BP 118/60 (BP Location: Right Arm, Patient Position: Sitting, Cuff Size: Large)   Pulse (!) 56   Ht 5' 4.5" (1.638 m)   Wt 231 lb (104.8 kg)   LMP 11/14/2015 (Exact Date)   BMI 39.04 kg/m     General appearance: alert, cooperative and appears stated age   Technique:  Both transabdominal and transvaginal ultrasound examinations of the pelvis were performed. Transabdominal technique was performed for global imaging of the pelvis including uterus, ovaries, adnexal regions, and pelvic cul-de-sac. It was necessary to proceed with endovaginal exam following the abdominal ultrasound.  Transabdominal exam to visualize the endometrium and adnexa.  Color and duplex Doppler  ultrasound was utilized to evaluate blood flow to the ovaries.   Pelvic ultrasound -  Myometrium consistent with possible adenomyosis.  EMS 3 mm. Ovaries normal with 6 mm right ovarian follicle.  No adnexal masses.  No free fluid.  Procedure - sonohysterogram Consent performed. Speculum placed in vagina. Sterile prep of cervix with  Betadine. Cannula placed inside endometrial cavity without difficulty. Speculum removed. Sterile saline injected.      No        filling defect noted. Cannula removed. No complication.   Procedure - endometrial biopsy Consent performed. Speculum place in vagina.  Sterile prep of cervix with  Betadine. Pipelle placed to    7      cm without difficulty twice. Tissue obtained and sent to pathology. Speculum removed.  No complications. Minimal EBL.  ASSESSMENT  Recurrent postmenopausal bleeding? Reassuring ultrasound.  Possible adenomyosis.   PLAN  Discussion of postmenopausal bleeding. Etiologies include polyp, infection, hyperplasia and cancer.  Follow up EMB with final recommendations to follow. Post EMB precautions given.    An After Visit Summary was printed and given to the patient.  _15_____ minutes face to face time of which over 50% was spent in counseling.

## 2015-12-20 ENCOUNTER — Encounter: Payer: Self-pay | Admitting: Family Medicine

## 2015-12-20 ENCOUNTER — Ambulatory Visit
Admission: RE | Admit: 2015-12-20 | Discharge: 2015-12-20 | Disposition: A | Discharge status: Home / Self Care | Payer: 59 | Source: Ambulatory Visit | Attending: Obstetrics and Gynecology | Admitting: Obstetrics and Gynecology

## 2015-12-20 DIAGNOSIS — Z1231 Encounter for screening mammogram for malignant neoplasm of breast: Secondary | ICD-10-CM

## 2015-12-20 LAB — IPS OTHER TISSUE BIOPSY

## 2015-12-21 ENCOUNTER — Telehealth: Payer: Self-pay

## 2015-12-21 ENCOUNTER — Encounter: Payer: Self-pay | Admitting: Obstetrics and Gynecology

## 2015-12-21 NOTE — Telephone Encounter (Signed)
Spoke with patient. Advised of message and results as seen below from [place]. Patient is agreeable and verbalizes understanding.  Routing to provider for final review. Patient agreeable to disposition. Will close encounter.

## 2015-12-21 NOTE — Telephone Encounter (Signed)
-----   Message from Nunzio Cobbs, MD sent at 12/20/2015  2:26 PM EDT ----- Please inform patient of negative endometrial biopsy showing atropic tissue.  No sign of polyp, hyperplasia or malignancy were seen.  Please call the office for any future bleeding.   Cc- Marisa Sprinkles

## 2015-12-27 ENCOUNTER — Encounter: Payer: Self-pay | Admitting: Family Medicine

## 2015-12-27 ENCOUNTER — Ambulatory Visit (INDEPENDENT_AMBULATORY_CARE_PROVIDER_SITE_OTHER): Payer: 59 | Admitting: Family Medicine

## 2015-12-27 VITALS — BP 102/80 | HR 75 | Temp 97.6°F | Ht 64.5 in | Wt 232.3 lb

## 2015-12-27 DIAGNOSIS — M545 Low back pain, unspecified: Secondary | ICD-10-CM

## 2015-12-27 DIAGNOSIS — E669 Obesity, unspecified: Secondary | ICD-10-CM | POA: Diagnosis not present

## 2015-12-27 DIAGNOSIS — R600 Localized edema: Secondary | ICD-10-CM

## 2015-12-27 MED ORDER — CYCLOBENZAPRINE HCL 5 MG PO TABS: 5.0000 mg | ORAL_TABLET | Freq: Every evening | ORAL | 0 refills | Status: DC | PRN

## 2015-12-27 NOTE — Patient Instructions (Signed)
BEFORE YOU LEAVE: -follow up: 3 months -low back exercises  For the back pain: -exercises provided 4 days per week -topical sports creams as needed (available over the counter) -heat 15 minutes twice daily -muscle relaxer at night  For 1-2 weeks -follow up if worsening or if persists in 4 weeks   We recommend the following healthy lifestyle for LIFE: 1) Small portions.   Tip: eat off of a salad plate instead of a dinner plate.  Tip: It is ok to feel hungry after a meal - that likely means you ate an appropriate portion.  Tip: if you need more or a snack choose fruits, veggies and/or a handful of nuts or seeds.  2) Eat a healthy clean diet.  * Tip: Avoid (less then 1 serving per week): processed foods, sweets, sweetened drinks, white starches (rice, flour, bread, potatoes, pasta, etc), red meat, fast foods, butter  *Tip: CHOOSE instead   * 5-9 servings per day of fresh or frozen fruits and vegetables (but not corn, potatoes, bananas, canned or dried fruit)   *nuts and seeds, beans   *olives and olive oil   *small portions of lean meats such as fish and white chicken    *small portions of whole grains  3)Get at least 150 minutes of sweaty aerobic exercise per week.  4)Reduce stress - consider counseling, meditation and relaxation to balance other aspects of your life. e

## 2015-12-27 NOTE — Progress Notes (Signed)
Pre visit review using our clinic review tool, if applicable. No additional management support is needed unless otherwise documented below in the visit note. 

## 2015-12-27 NOTE — Progress Notes (Signed)
HPI:  Follow up:  Tr LE edema: -labs normal (CBC, BMP, TSH, BNP) -exercise, healthy diet, compression and exercise advised -she is doing weight watchers and starting to exercise some -has not really notice much edema recently, still feels like some in ankles  Back pain -reports intermittent low back pain for 2 weeks -notices mild pain in R low back a few days per week -has bee exercising, doing core exercising and seeing chiropractor for adjustments for foot pain -saw gyn and normal evaluation -denies: radiation, fevers, malaise, weakness, numbness, dysuria, bowel or bladder changes  ROS: See pertinent positives and negatives per HPI.  Past Medical History:  Diagnosis Date  . Acute appendicitis 07/22/2013  . Allergy   . Arthritis    bilateral knees, back  . Complicated migraine XX123456  . Elevated TSH 08/05/2011  . Frequent headaches   . Heart murmur    as a child    Past Surgical History:  Procedure Laterality Date  . FOREARM SURGERY     plates inserted then removed later  . LAPAROSCOPIC APPENDECTOMY N/A 07/22/2013   Procedure: APPENDECTOMY LAPAROSCOPIC;  Surgeon: Odis Hollingshead, MD;  Location: [facility];  Service: General;  Laterality: N/A;  . TONSILLECTOMY    . TUBAL LIGATION    . WISDOM TOOTH EXTRACTION      Family History  Problem Relation Age of Onset  . Alcohol abuse Father   . Osteoporosis Paternal Grandmother   . Cancer Maternal Grandfather     Social History   Social History  . Marital status: Married    Spouse name: N/A  . Number of children: N/A  . Years of education: N/A   Social History Main Topics  . Smoking status: Never Smoker  . Smokeless tobacco: Never Used  . Alcohol use No  . Drug use: No  . Sexual activity: Yes    Partners: Male    Birth control/ protection: Surgical     Comment: Tubal   Other Topics Concern  . None   Social History Narrative   Works at [employer]   1 child 17y.o. Living at  home, lives with husband- married 7 years ago.    Christian   Starting exercise program; diet is healthy              Current Outpatient Prescriptions:  .  acetaminophen (TYLENOL) 500 MG tablet, Take 500 mg by mouth every 6 (six) hours as needed., Disp: , Rfl:  .  BIOTIN 5000 PO, Take by mouth daily., Disp: , Rfl:  .  Calcium Carb-Cholecalciferol (CALCIUM + D3 PO), Take by mouth., Disp: , Rfl:  .  Glucosamine-Chondroitin (OSTEO BI-FLEX REGULAR STRENGTH PO), Take by mouth., Disp: , Rfl:  .  Multiple Vitamin (MULTIVITAMIN) capsule, Take 1 capsule by mouth daily., Disp: , Rfl:  .  Omega-3 Fatty Acids (FISH OIL PO), Take by mouth. Mega red, Disp: , Rfl:  .  cyclobenzaprine (FLEXERIL) 5 MG tablet, Take 1 tablet (5 mg total) by mouth at bedtime as needed for muscle spasms., Disp: 15 tablet, Rfl: 0  EXAM:  Vitals:   12/27/15 1445  BP: 102/80  Pulse: 75  Temp: 97.6 F (36.4 C)    Body mass index is 39.26 kg/m.  GENERAL: vitals reviewed and listed above, alert, oriented, appears well hydrated and in no acute distress  HEENT: atraumatic, conjunttiva clear, no obvious abnormalities on inspection of external nose and ears  NECK: no obvious masses on inspection  LUNGS: clear to auscultation  bilaterally, no wheezes, rales or rhonchi, good air movement  CV: HRRR, no peripheral edema  MS: moves all extremities without noticeable abnormality Normal Gait Normal inspection of back, no obvious scoliosis or leg length descrepancy No bony TTP Soft tissue TTP at: R upper lumbar paraspinal muscles in specific location around L1-L2 -/+ tests: neg trendelenburg,-facet loading, -SLRT, -CLRT, -FABER, -FADIR Normal muscle strength, sensation to light touch and DTRs in LEs bilaterally  PSYCH: pleasant and cooperative, no obvious depression or anxiety  ASSESSMENT AND PLAN:  Discussed the following assessment and plan:  Right-sided low back pain without sciatica -suspect muscular pain given  reproducible muscle soreness on exam -opted to start with muscle relaxer, heat, HEP, OTC anagesic prn -follow up in 1 month unless symptoms resolved, sooner if worsening  Obesity -congratulated and supported on lifestyle changes -follow up in 3-4 months  Bilateral edema of lower extremity -none on exam today -monitor  -Patient advised to return or notify a doctor immediately if symptoms worsen or persist or new concerns arise.  Patient Instructions  BEFORE YOU LEAVE: -follow up: 3 months -low back exercises  For the back pain: -exercises provided 4 days per week -topical sports creams as needed (available over the counter) -heat 15 minutes twice daily -muscle relaxer at night  For 1-2 weeks -follow up if worsening or if persists in 4 weeks   We recommend the following healthy lifestyle for LIFE: 1) Small portions.   Tip: eat off of a salad plate instead of a dinner plate.  Tip: It is ok to feel hungry after a meal - that likely means you ate an appropriate portion.  Tip: if you need more or a snack choose fruits, veggies and/or a handful of nuts or seeds.  2) Eat a healthy clean diet.  * Tip: Avoid (less then 1 serving per week): processed foods, sweets, sweetened drinks, white starches (rice, flour, bread, potatoes, pasta, etc), red meat, fast foods, butter  *Tip: CHOOSE instead   * 5-9 servings per day of fresh or frozen fruits and vegetables (but not corn, potatoes, bananas, canned or dried fruit)   *nuts and seeds, beans   *olives and olive oil   *small portions of lean meats such as fish and white chicken    *small portions of whole grains  3)Get at least 150 minutes of sweaty aerobic exercise per week.  4)Reduce stress - consider counseling, meditation and relaxation to balance other aspects of your life. e    Lucretia Kern., DO

## 2016-02-28 ENCOUNTER — Other Ambulatory Visit: Payer: Self-pay | Admitting: Family Medicine

## 2016-03-04 ENCOUNTER — Encounter: Payer: Self-pay | Admitting: Obstetrics and Gynecology

## 2016-03-06 ENCOUNTER — Telehealth: Payer: Self-pay

## 2016-03-06 NOTE — Telephone Encounter (Signed)
Non-Urgent Medical Question  Message [id number]  From Denise Reese To Denise Cobbs, MD Sent 03/04/2016 10:39 AM  [email or web address]   Good morning Dr Quincy Simmonds,  I just wanted to share this YouTube series that I found on menopause it gives a lot of unbiased information and there are three outline notes that can be printed and followed. I was thinking about one thing she said that and informed woman is beneficial to her gynecologist. 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 She has a book which I have not purchased because I know I will not read it but the video tutorials have been very beneficial to me to help me understand what is going on inside my body and help me understand what choices are available and reiterates the things you have been saying about healthy diet and exercise. Although she has severe arthritis she is very fit it seems that she does work out every day and [place] it to her abilities. I have been thinking about sharing this with you but I was thinking she is a doctor and who am I to try to send something to her. Obviously I have overcome that thought and if it can  help educate somebody to make better decisions about managing their menopause it is worth it. They don't give you enough time to go through all this stuff with [place] although you do take a lot of time for [place] and it is well appreciated you take more time Then any other doctor I have ever seen. If you get the chance to see it and you are able to tell me your thoughts on it I would like that very much. I hope you have a wonderful Thanksgiving and a very merry Christmas. Thank you again for all you have done to help me thus far I know you will do in the future.  Sincerely,  Denise Reese  It is a YouTube channel it is called  menopause Lovena Le  just in case you can't access it from the link above   Responsible  Party   Pool - [facility] Pool No one has taken responsibility for this message.  No actions have been taken on this message.   Routing to Dr.Silva for review.

## 2016-03-06 NOTE — Telephone Encounter (Signed)
Routed to Dr Silva for review.  °

## 2016-03-14 NOTE — Telephone Encounter (Signed)
Dr.Silva, okay to close encounter? 

## 2016-03-15 NOTE — Telephone Encounter (Signed)
Encounter closed

## 2016-03-17 ENCOUNTER — Ambulatory Visit: Payer: 59 | Admitting: Family Medicine

## 2016-04-10 ENCOUNTER — Other Ambulatory Visit (INDEPENDENT_AMBULATORY_CARE_PROVIDER_SITE_OTHER): Payer: Self-pay | Admitting: Orthopaedic Surgery

## 2016-05-01 ENCOUNTER — Telehealth: Payer: Self-pay | Admitting: Obstetrics and Gynecology

## 2016-05-01 NOTE — Telephone Encounter (Signed)
Patient would like to speak with someone about possibly going on estrogen therapy for menopause.

## 2016-05-01 NOTE — Telephone Encounter (Signed)
Spoke with patient. Patient states she is postmenopausal. Patient states she has been discussing with Dr. Quincy Simmonds through [place] thoughts about estrogen replacement and would like to schedule an OV. Patient scheduled for 05/04/16 at 3:30pm with Dr. Quincy Simmonds. Patient is agreeable to date and time.  Routing to provider for final review. Patient is agreeable to disposition. Will close encounter.

## 2016-05-04 ENCOUNTER — Ambulatory Visit (INDEPENDENT_AMBULATORY_CARE_PROVIDER_SITE_OTHER): Payer: 59 | Admitting: Obstetrics and Gynecology

## 2016-05-04 VITALS — BP 110/80 | HR 80 | Resp 20 | Ht 64.5 in | Wt 239.0 lb

## 2016-05-04 DIAGNOSIS — N951 Menopausal and female climacteric states: Secondary | ICD-10-CM | POA: Diagnosis not present

## 2016-05-04 MED ORDER — PROGESTERONE MICRONIZED 100 MG PO CAPS: 100.0000 mg | ORAL_CAPSULE | Freq: Every day | ORAL | 2 refills | Status: DC

## 2016-05-04 MED ORDER — ESTRADIOL 0.0375 MG/24HR TD PTTW: MEDICATED_PATCH | TRANSDERMAL | 2 refills | Status: DC

## 2016-05-04 NOTE — Progress Notes (Signed)
GYNECOLOGY  VISIT   HPI: 51 y.o.   Married  Caucasian  female   G2P1011 with Patient's last menstrual period was 11/14/2015.   here for HRT Consult.  Considering hormone therapy for general health benefits. Hot flashes are tolerable.   Takes melatonin to help with sleep. Gets busy at work and has some anxiety at times and this prevents her from sleeping well. Think her stress has caused symptoms in past of chest discomfort. Has had a negative work up including normal stress myoview. Denies depression.  Did have episode of rash and facial numbness with she look LoLoEstrin short term. No stroke.  Struggling with weight.  Has less energy.  Skipping dinner.   Has never gone a full year without vaginal bleeding. Menses:  July 2016, April 2017, August 2017.  FSH 58 and estradiol 16.3 - 04/16/15. [place] 52.6 and estradiol 17 - 12/09/15.  Had evaluation for potential postmenopausal bleeding last year: Pelvic ultrasound - 12/16/15 Myometrium consistent with possible adenomyosis.  EMS 3 mm. Ovaries normal with 6 mm right ovarian follicle.  No adnexal masses.  No free fluid. Sonohysterogram - no filling defects. EMB done - benign endometrium.   GYNECOLOGIC HISTORY: Patient's last menstrual period was 11/14/2015. Contraception: Tubal Ligation  Menopausal hormone therapy:  None Last mammogram:  12/20/15 BIRADS1:Neg  Last pap smear:   12/09/15 Neg. HR HPV:Neg         OB History    Gravida Para Term Preterm AB Living   2 1 1   1 1    SAB TAB Ectopic Multiple Live Births     1               Patient Active Problem List   Diagnosis Date Noted  . Irregular menstrual bleeding 12/19/2013  . Obesity 12/19/2013  . Hot flashes 12/19/2013    Past Medical History:  Diagnosis Date  . Acute appendicitis 07/22/2013  . Allergy   . Arthritis    bilateral knees, back  . Complicated migraine [id number]  . Elevated TSH 08/05/2011  . Frequent headaches   . Heart murmur    as a child    Past  Surgical History:  Procedure Laterality Date  . FOREARM SURGERY     plates inserted then removed later  . LAPAROSCOPIC APPENDECTOMY N/A 07/22/2013   Procedure: APPENDECTOMY LAPAROSCOPIC;  Surgeon: Odis Hollingshead, MD;  Location: [facility];  Service: General;  Laterality: N/A;  . TONSILLECTOMY    . TUBAL LIGATION    . WISDOM TOOTH EXTRACTION      Current Outpatient Prescriptions  Medication Sig Dispense Refill  . acetaminophen (TYLENOL) 500 MG tablet Take 500 mg by mouth every 6 (six) hours as needed.    Marland Kitchen BIOTIN 5000 PO Take by mouth daily.    . Calcium Carb-Cholecalciferol (CALCIUM + D3 PO) Take by mouth.    . Glucosamine-Chondroitin (OSTEO BI-FLEX REGULAR STRENGTH PO) Take by mouth.    . Multiple Vitamin (MULTIVITAMIN) capsule Take 1 capsule by mouth daily.    . Omega-3 Fatty Acids (FISH OIL PO) Take by mouth. Mega red     No current facility-administered medications for this visit.      ALLERGIES: Sulfa antibiotics; Codeine; and Lo loestrin fe [norethin-eth estrad-fe biphas]  Family History  Problem Relation Age of Onset  . Alcohol abuse Father   . Osteoporosis Paternal Grandmother   . Cancer Maternal Grandfather     Social History   Social History  . Marital status: Married  Spouse name: N/A  . Number of children: N/A  . Years of education: N/A   Occupational History  . Not on file.   Social History Main Topics  . Smoking status: Never Smoker  . Smokeless tobacco: Never Used  . Alcohol use No  . Drug use: No  . Sexual activity: Yes    Partners: Male    Birth control/ protection: Surgical     Comment: Tubal   Other Topics Concern  . Not on file   Social History Narrative   Works at [employer]   1 child 17y.o. Living at home, lives with husband- married 7 years ago.    Christian   Starting exercise program; diet is healthy             ROS:  Pertinent items are noted in HPI.  PHYSICAL EXAMINATION:    BP 110/80 (BP  Location: Right Arm, Patient Position: Sitting, Cuff Size: Large)   Pulse 80   Resp 20   Ht 5' 4.5" (1.638 m)   Wt 239 lb (108.4 kg)   LMP 11/14/2015   BMI 40.39 kg/m     General appearance: alert, cooperative and appears stated age   ASSESSMENT  Menopausal symptoms.  Anxiety.  PLAN  Will do a 3 months trial of Minivelle 0.0375 mg twice weekly and Prometrium 100 mg daily. I discussed risks of DVT, PE, MI, stroke, and breast cancer.  We discussed methods of reduction of risk for cardiovascular disease and osteoporosis.  Ok to continue Melatonin if needed. I recommend she not skip meals.  We discussed a regular exercise program 3 times a week.  Written materials on menopause. Follow up in 3 months, sooner as needed.   An After Visit Summary was printed and given to the patient.  _25____ minutes face to face time of which over 50% was spent in counseling.

## 2016-05-05 ENCOUNTER — Encounter: Payer: Self-pay | Admitting: Obstetrics and Gynecology

## 2016-05-05 ENCOUNTER — Telehealth: Payer: Self-pay | Admitting: Obstetrics and Gynecology

## 2016-05-05 NOTE — Telephone Encounter (Signed)
Patient is calling to speak with the nurse. She will need a prior authorization done to get her medication.Marland Kitchen

## 2016-05-05 NOTE — Telephone Encounter (Signed)
PA sent via cover my meds. Will await response from insurance company regarding approval or denial of PA for Estradiol patch. This can take 24-72 hours. Patient has been notified via mychart as she sent a mychart message as well regarding PA.  Routing to provider for final review. Patient agreeable to disposition. Will close encounter.

## 2016-05-24 ENCOUNTER — Telehealth: Payer: Self-pay

## 2016-05-24 MED ORDER — VIVELLE-DOT 0.0375 MG/24HR TD PTTW: 1.0000 | MEDICATED_PATCH | TRANSDERMAL | 2 refills | Status: DC

## 2016-05-24 NOTE — Telephone Encounter (Signed)
Patient returned call

## 2016-05-24 NOTE — Telephone Encounter (Signed)
Spoke with patient. Advised PA for coverage of [place] has been denied by her insurance company. Dr.SIlva's recommendation is for the patient to try brand Vivelle Dot 0.0375 mg twice weekly patches for 3 months #8 2RF sent to pharmacy on file. Patient is agreeable and verbalizes understanding. Form from [facility] with Dr.Silva's recommendations to scan.  Routing to provider for final review. Patient agreeable to disposition. Will close encounter.

## 2016-05-24 NOTE — Telephone Encounter (Signed)
Left message to call [place] at [phone].  Need to advised patient PA was submitted to [facility] for Minivelle 0.0375 mg patches has been denied. Patient may pay OOP for this rx or Dr.Silva recommends trying Vivelle Dot 0.0375 twice weekly patches for 3 months.

## 2016-07-15 ENCOUNTER — Other Ambulatory Visit: Payer: Self-pay | Admitting: Obstetrics and Gynecology

## 2016-07-17 NOTE — Telephone Encounter (Signed)
Medication refill request: Progesterone 100mg  Last AEX:  12/09/15 BS Next AEX: 12/11/16 Last MMG (if hormonal medication request): 12/20/15 BIRADS 1 negative/density b Refill authorized: 05/04/16 #30 w/2 refills; today please advise  Will call pharmacy when they open to confirm if refill is needed

## 2016-07-17 NOTE — Telephone Encounter (Signed)
Spoke with pharmacy, patient is out of refills. Please advise on rx.

## 2016-08-03 ENCOUNTER — Encounter: Payer: Self-pay | Admitting: Obstetrics and Gynecology

## 2016-08-03 ENCOUNTER — Ambulatory Visit (INDEPENDENT_AMBULATORY_CARE_PROVIDER_SITE_OTHER): Payer: 59 | Admitting: Obstetrics and Gynecology

## 2016-08-03 VITALS — BP 126/74 | HR 70 | Ht 64.5 in | Wt 240.4 lb

## 2016-08-03 DIAGNOSIS — Z79890 Hormone replacement therapy (postmenopausal): Secondary | ICD-10-CM | POA: Diagnosis not present

## 2016-08-03 MED ORDER — PROGESTERONE MICRONIZED 100 MG PO CAPS: ORAL_CAPSULE | ORAL | 4 refills | Status: DC

## 2016-08-03 MED ORDER — ESTRADIOL 0.05 MG/24HR TD PTTW: 1.0000 | MEDICATED_PATCH | TRANSDERMAL | 4 refills | Status: DC

## 2016-08-03 NOTE — Progress Notes (Signed)
GYNECOLOGY  VISIT   HPI: 51 y.o.   Married  Caucasian  female   G2P1011 with Patient's last menstrual period was 11/14/2015 (approximate).   here for 3 month follow up on HRT.  On Vivelle Dot 0.0375 twice weekly and Prometrium 100 mg daily.   Patient feels she has a little more energy now.  She states she does have some aches in legs and arms. Legs were aching before starting the HRT. Sleeping better but still tired. No hot flashes or night sweats. Anxiety is gone.   No vaginal bleeding.   TSH normal in August 2017.  No facial numbness with taking the hormones.  Doing healthy eating and exercising and trying to loose weight.  Sees PCP yearly. No current appointment.   PCP - Dr. Colin Benton.   GYNECOLOGIC HISTORY: Patient's last menstrual period was 11/14/2015 (approximate). Contraception: Tubal Menopausal hormone therapy: Prometrium 100mg , Vivelle Dot 0.0375mg  Last mammogram:  12/20/15 BIRADS1:Neg   Last pap smear:  12/09/15 Neg. HR HPV:Neg         OB History    Gravida Para Term Preterm AB Living   2 1 1   1 1    SAB TAB Ectopic Multiple Live Births     1               Patient Active Problem List   Diagnosis Date Noted  . Irregular menstrual bleeding 12/19/2013  . Obesity 12/19/2013  . Hot flashes 12/19/2013    Past Medical History:  Diagnosis Date  . Acute appendicitis 07/22/2013  . Allergy   . Arthritis    bilateral knees, back  . Complicated migraine 07/07/386  . Elevated TSH 08/05/2011  . Frequent headaches   . Heart murmur    as a child    Past Surgical History:  Procedure Laterality Date  . FOREARM SURGERY     plates inserted then removed later  . LAPAROSCOPIC APPENDECTOMY N/A 07/22/2013   Procedure: APPENDECTOMY LAPAROSCOPIC;  Surgeon: Odis Hollingshead, MD;  Location: [facility];  Service: General;  Laterality: N/A;  . TONSILLECTOMY    . TUBAL LIGATION    . WISDOM TOOTH EXTRACTION      Current Outpatient Prescriptions  Medication Sig Dispense Refill   . acetaminophen (TYLENOL) 500 MG tablet Take 500 mg by mouth every 6 (six) hours as needed.    Marland Kitchen BIOTIN 5000 PO Take by mouth daily.    . Multiple Vitamin (MULTIVITAMIN) capsule Take 1 capsule by mouth daily.    . progesterone (PROMETRIUM) 100 MG capsule TAKE 1 CAPSULE(100 MG) BY MOUTH DAILY 30 capsule 0  . VIVELLE-DOT 0.0375 MG/24HR Place 1 patch onto the skin 2 (two) times a week. 8 patch 2   No current facility-administered medications for this visit.      ALLERGIES: Sulfa antibiotics; Codeine; and Lo loestrin fe [norethin-eth estrad-fe biphas]  Family History  Problem Relation Age of Onset  . Alcohol abuse Father   . Osteoporosis Paternal Grandmother   . Cancer Maternal Grandfather     Social History   Social History  . Marital status: Married    Spouse name: N/A  . Number of children: N/A  . Years of education: N/A   Occupational History  . Not on file.   Social History Main Topics  . Smoking status: Never Smoker  . Smokeless tobacco: Never Used  . Alcohol use No  . Drug use: No  . Sexual activity: Yes    Partners: Male    Birth  control/ protection: Surgical     Comment: Tubal   Other Topics Concern  . Not on file   Social History Narrative   Works at [employer]   1 child 17y.o. Living at home, lives with husband- married 7 years ago.    Christian   Starting exercise program; diet is healthy             ROS:  Pertinent items are noted in HPI.  PHYSICAL EXAMINATION:    BP 126/74 (BP Location: Right Arm, Patient Position: Sitting, Cuff Size: Large)   Pulse 70   Ht 5' 4.5" (1.638 m)   Wt 240 lb 6.4 oz (109 kg)   LMP 11/14/2015 (Approximate)   BMI 40.63 kg/m     General appearance: alert, cooperative and appears stated age   ASSESSMENT  Menopausal symptoms improved on HRT.   PLAN  Will increase Vivelle Dot to 0.05 mg twice weekly. Continue Prometrium 100 mg daily.  Call for any vaginal bleeding. If fatigue  persists, to PCP.  Follow up for annual exam in September.   An After Visit Summary was printed and given to the patient.  ___15___ minutes face to face time of which over 50% was spent in counseling.

## 2016-08-28 IMAGING — MR MR HEAD WO/W CM
10 of 13 series · 34 of 48 positions shown · IV contrast (multihance)
Comparison: None.

CLINICAL DATA: Transient alteration of awareness.  Headache.

EXAM:
MRI HEAD WITHOUT AND WITH CONTRAST
TECHNIQUE: Multiplanar, multiecho pulse sequences of the brain and surrounding
structures were obtained without and with intravenous contrast.
CONTRAST:  20mL MULTIHANCE GADOBENATE DIMEGLUMINE 529 MG/ML IV SOLN

[Series 3: DWI · axial · 3.0mm · 1.09mm/px · z∈[-61,+71]mm · 9 of 92 slices shown (1 of 4)]
[im 1/92]
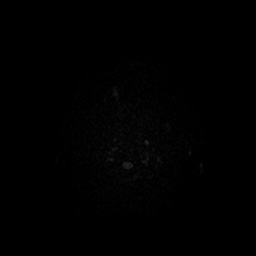
[im 12/92]
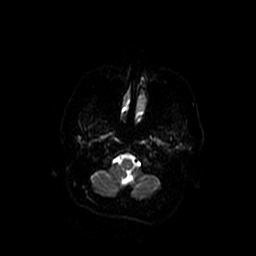
[im 23/92]
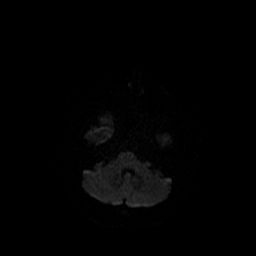
[im 35/92]
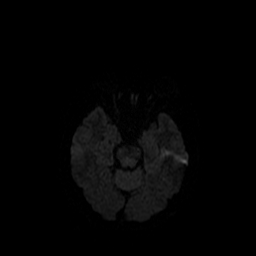
[im 46/92]
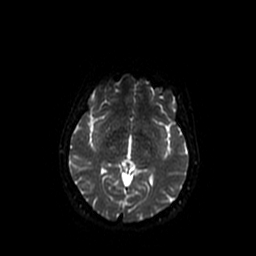
[im 57/92]
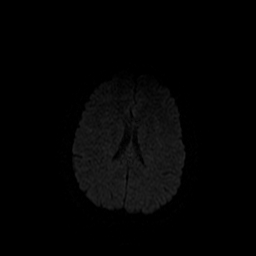
[im 69/92]
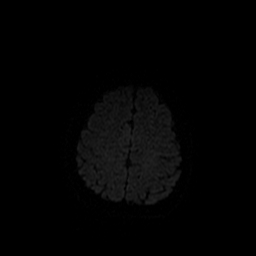
[im 80/92]
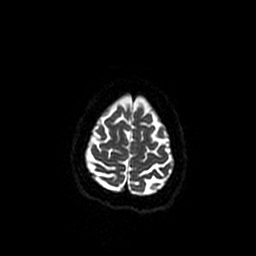
[im 92/92]
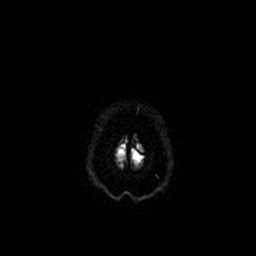

[Series 4: T1 · sagittal · 5.0mm · 0.47mm/px · 2 of 23 slices shown]
[im 1/23]
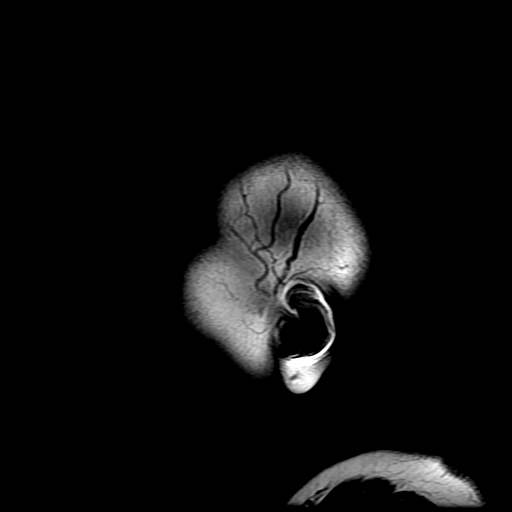
[im 23/23]
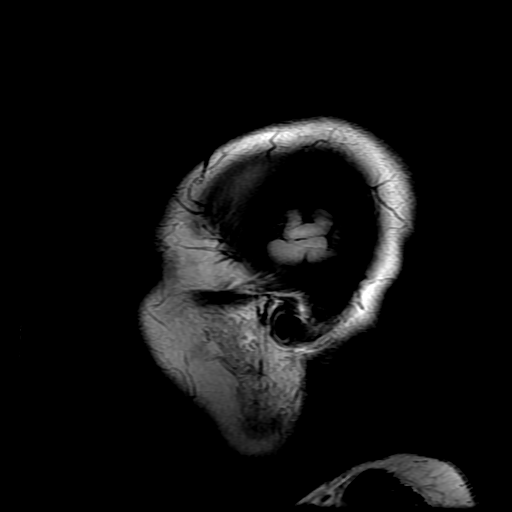

[Series 5: T2 · axial · 5.0mm · 0.43mm/px · z∈[-63,+77]mm · 2 of 25 slices shown (1 of 2)]
[im 1/25]
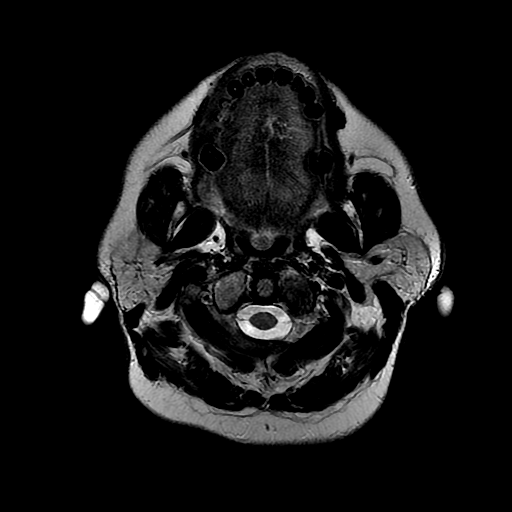
[im 25/25]
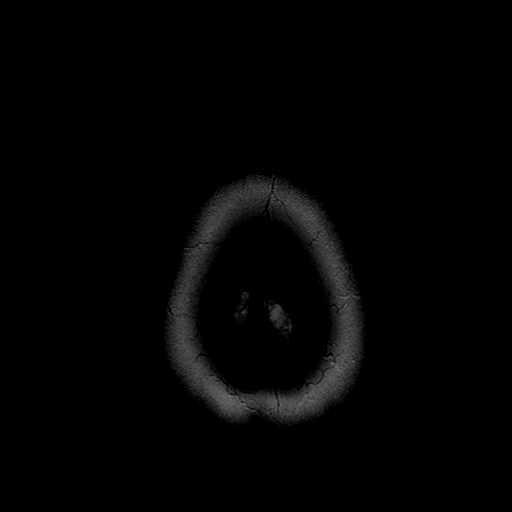

[Series 6: DWI · coronal · 5.0mm · 1.09mm/px · 6 of 66 slices shown (2 of 4)]
[im 1/66]
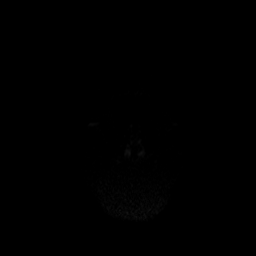
[im 14/66]
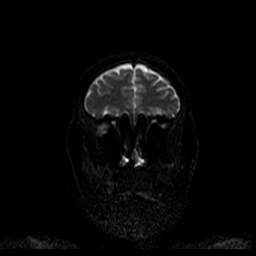
[im 27/66]
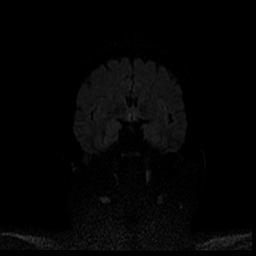
[im 40/66]
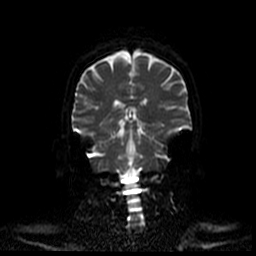
[im 53/66]
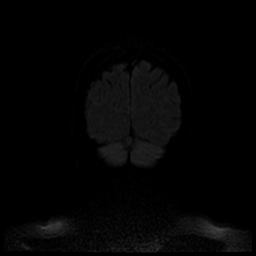
[im 66/66]
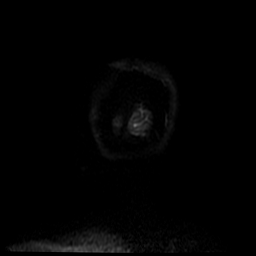

[Series 7: T2 · coronal · 3.0mm · 0.35mm/px · 2 of 27 slices shown (2 of 2)]
[im 1/27]
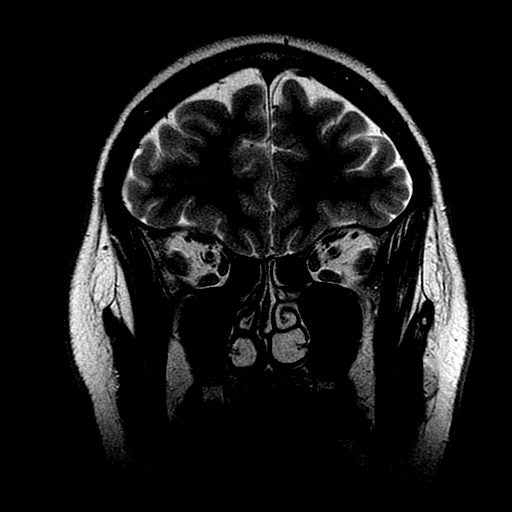
[im 27/27]
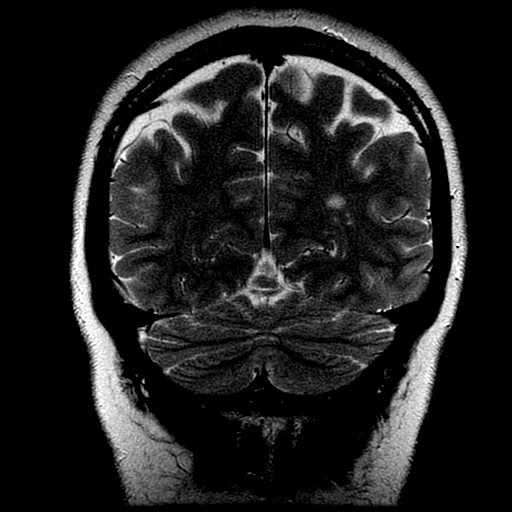

[Series 8: FLAIR · axial · 5.0mm · 0.43mm/px · z∈[-69,+64]mm · 2 of 21 slices shown]
[im 1/21]
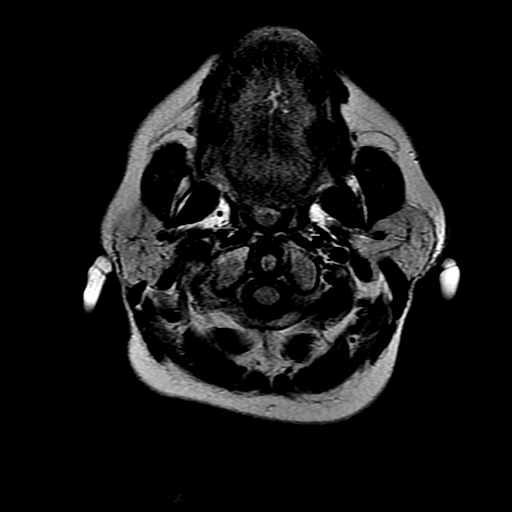
[im 21/21]
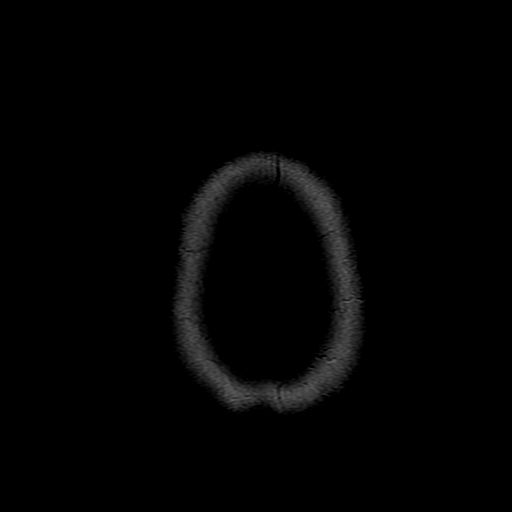

[Series 13: T2 post-contrast · coronal · 5.0mm · 0.39mm/px · 2 of 26 slices shown]
[im 1/26]
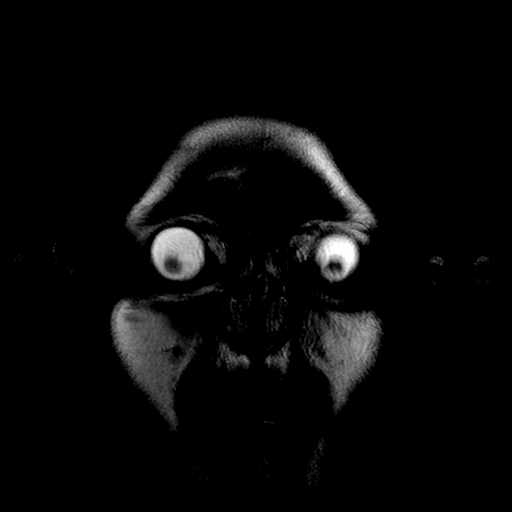
[im 26/26]
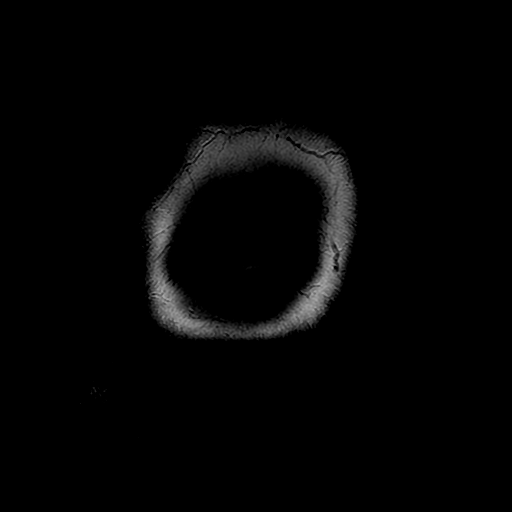

[Series 15: T1 post-contrast · coronal · 5.0mm · 0.39mm/px · 2 of 26 slices shown]
[im 1/26]
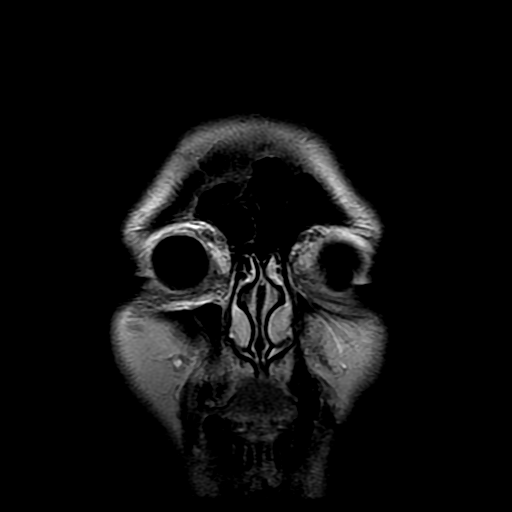
[im 26/26]
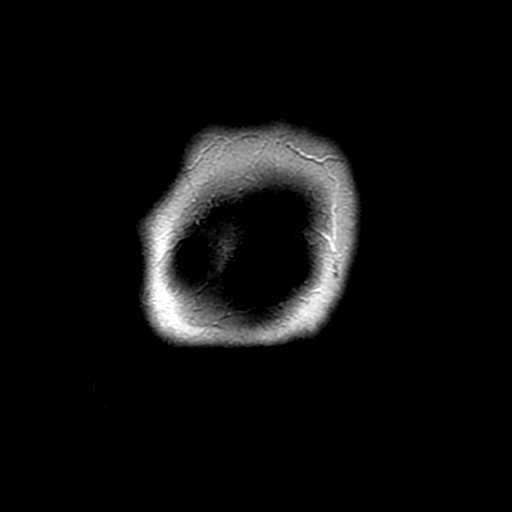

[Series 300: DWI · axial · 3.0mm · 1.09mm/px · z∈[-61,+71]mm · 4 of 46 slices shown (3 of 4)]
[im 1/46]
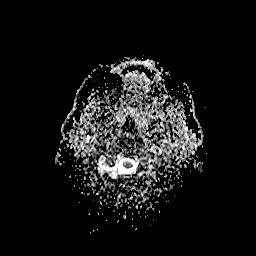
[im 16/46]
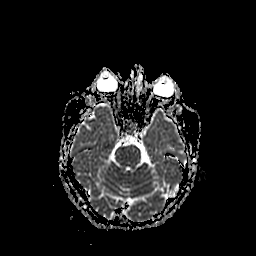
[im 31/46]
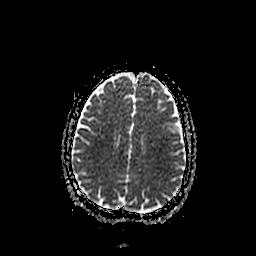
[im 46/46]
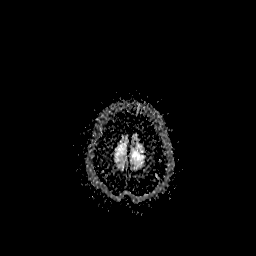

[Series 600: DWI · coronal · 5.0mm · 1.09mm/px · 3 of 33 slices shown (4 of 4)]
[im 1/33]
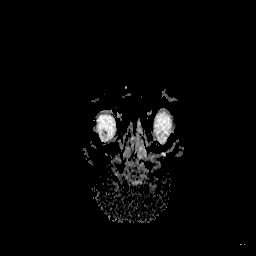
[im 17/33]
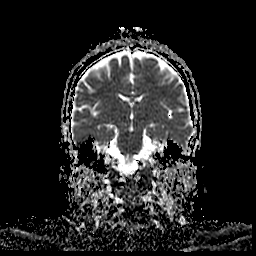
[im 33/33]
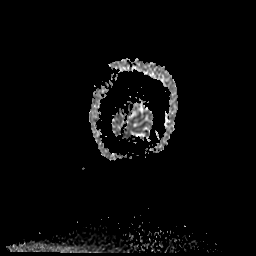

[34 of 48 positions shown; findings below may reference images not displayed]

FINDINGS: Ventricle size is normal. Cerebral volume is normal. Craniocervical
junction is normal. Pituitary is normal in size.

Negative for acute infarct.

Subcentimeter solitary hyperintensity in the left parietal deep
white matter, nonspecific. Remainder of the white matter is normal.
Brainstem and cerebellum normal.

Negative for hemorrhage or fluid collection

Negative for mass or edema.

Normal enhancement following contrast infusion. No enhancing lesion
identified

Paranasal sinuses are clear.
IMPRESSION: No acute abnormality.

Subcentimeter hyperintensity left parietal white matter.
Differential diagnosis includes chronic microvascular ischemia and
demyelinating disease. Otherwise negative.

## 2016-09-01 ENCOUNTER — Telehealth: Payer: Self-pay

## 2016-09-01 ENCOUNTER — Encounter: Payer: Self-pay | Admitting: Obstetrics and Gynecology

## 2016-09-01 NOTE — Telephone Encounter (Signed)
Routing to [place] for review and advise. Patient is on Vivelle Dot 0.05 mg twice weekly and Prometrium 100 mg daily.  Visit Follow-Up Question  Message [phone]  From SHYANE FOSSUM To Nunzio Cobbs, MD Sent 09/01/2016 3:48 AM  Good morning  I just wanted to let you know that I forgot I had a light period in March and this week I have gotten a period it started very light Friday and has been like a normal period this week only not as heavy as it was before I stopped having them. I was supposed to let you know and I've been really busy this week so I wanted to take time now to send you a message.  Thank you I hope you have a blessed day   Responsible Party   Pool - [facility] Message taken by Gwendlyn Deutscher, RN on 09/01/2016 3:38 PM  No actions have been taken on this message.

## 2016-09-01 NOTE — Telephone Encounter (Signed)
Please have patient make an appointment to see me next week.

## 2016-09-01 NOTE — Telephone Encounter (Signed)
Telephone encounter created to review with Dr.Silva. 

## 2016-09-04 NOTE — Telephone Encounter (Signed)
Spoke with patient. Advised of message as seen below from [place]. Patient is agreeable. Appointment scheduled for 09/07/2016 at 3:30 pm with Dr.Silva. Patient is agreeable to date and time.  Routing to provider for final review. Patient agreeable to disposition. Will close encounter.

## 2016-09-04 NOTE — Telephone Encounter (Signed)
Attempted to reach patient at number provided [phone], there was an answer but phone connection was loud and full of static no one spoke on the line and then phone call dropped. Attempted to reach patient again with no answer.

## 2016-09-07 ENCOUNTER — Encounter: Payer: Self-pay | Admitting: Obstetrics and Gynecology

## 2016-09-07 ENCOUNTER — Ambulatory Visit (INDEPENDENT_AMBULATORY_CARE_PROVIDER_SITE_OTHER): Payer: 59 | Admitting: Obstetrics and Gynecology

## 2016-09-07 VITALS — BP 110/60 | HR 72 | Resp 16 | Wt 233.0 lb

## 2016-09-07 DIAGNOSIS — N939 Abnormal uterine and vaginal bleeding, unspecified: Secondary | ICD-10-CM

## 2016-09-07 DIAGNOSIS — Z79890 Hormone replacement therapy (postmenopausal): Secondary | ICD-10-CM | POA: Diagnosis not present

## 2016-09-07 NOTE — Progress Notes (Signed)
GYNECOLOGY  VISIT   HPI: 51 y.o.   Married  Caucasian  female   G2P1011 with Patient's last menstrual period was 08/31/2016.   here for period started 08/25/16 - 09/03/16. Clotting and passedtissue like a real period.  No cramping.  No bleeding now.   Thinks she had a really light menses in Feb. 2018 also.  States she has never gone for a full year without a menses.  On HRT.  Vivelle Dot and Prometrium. Vivelle Dot increased to 0.05 mg and continued on Prometrium 100 mg daily on 08/03/16.  She chose to reduce her estrogen back to 54.0375 old patch she had after she had the bleeding episode recently.  Hot flashes are controlled.  Last hormone levels were done 12/09/15: FSH 52. 6 Estradiol 17  Sonohysterogram done 12/16/15: Possible adenomyosis.  No masses.  EMB benign at that time.   GYNECOLOGIC HISTORY: Patient's last menstrual period was 08/31/2016. Contraception:  Tubal Menopausal hormone therapy:  Prometrium 100mg , Vivelle-Dot 0.05mg  Last mammogram:  12/20/15 BIRADS1:Neg   Last pap smear:   12/09/15 Neg. HR HPV:Neg         OB History    Gravida Para Term Preterm AB Living   2 1 1   1 1    SAB TAB Ectopic Multiple Live Births     1               Patient Active Problem List   Diagnosis Date Noted  . Irregular menstrual bleeding 12/19/2013  . Obesity 12/19/2013  . Hot flashes 12/19/2013    Past Medical History:  Diagnosis Date  . Acute appendicitis 07/22/2013  . Allergy   . Arthritis    bilateral knees, back  . Complicated migraine 09/03/2295  . Elevated TSH 08/05/2011  . Frequent headaches   . Heart murmur    as a child    Past Surgical History:  Procedure Laterality Date  . FOREARM SURGERY     plates inserted then removed later  . LAPAROSCOPIC APPENDECTOMY N/A 07/22/2013   Procedure: APPENDECTOMY LAPAROSCOPIC;  Surgeon: Odis Hollingshead, MD;  Location: [facility];  Service: General;  Laterality: N/A;  . TONSILLECTOMY    . TUBAL LIGATION    . WISDOM TOOTH  EXTRACTION      Current Outpatient Prescriptions  Medication Sig Dispense Refill  . acetaminophen (TYLENOL) 500 MG tablet Take 500 mg by mouth every 6 (six) hours as needed.    Marland Kitchen BIOTIN 5000 PO Take by mouth daily.    Marland Kitchen estradiol (VIVELLE-DOT) 0.05 MG/24HR patch Place 1 patch (0.05 mg total) onto the skin 2 (two) times a week. 8 patch 4  . Multiple Vitamin (MULTIVITAMIN) capsule Take 1 capsule by mouth daily.    . progesterone (PROMETRIUM) 100 MG capsule TAKE 1 CAPSULE(100 MG) BY MOUTH DAILY 30 capsule 4   No current facility-administered medications for this visit.      ALLERGIES: Sulfa antibiotics; Codeine; and Lo loestrin fe [norethin-eth estrad-fe biphas]  Family History  Problem Relation Age of Onset  . Alcohol abuse Father   . Osteoporosis Paternal Grandmother   . Cancer Maternal Grandfather     Social History   Social History  . Marital status: Married    Spouse name: N/A  . Number of children: N/A  . Years of education: N/A   Occupational History  . Not on file.   Social History Main Topics  . Smoking status: Never Smoker  . Smokeless tobacco: Never Used  . Alcohol use No  .  Drug use: No  . Sexual activity: Yes    Partners: Male    Birth control/ protection: Surgical     Comment: Tubal   Other Topics Concern  . Not on file   Social History Narrative   Works at [employer]   1 child 17y.o. Living at home, lives with husband- married 7 years ago.    Christian   Starting exercise program; diet is healthy             ROS:  Pertinent items are noted in HPI.  PHYSICAL EXAMINATION:    BP 110/60 (BP Location: Right Arm, Patient Position: Sitting, Cuff Size: Normal)   Pulse 72   Resp 16   Wt 233 lb (105.7 kg)   LMP 08/31/2016   BMI 39.38 kg/m     General appearance: alert, cooperative and appears stated age   ASSESSMENT  Perimenopausal female? Abnormal uterine bleeding.  I am not certain if this is postmenopausal  bleeding or just a normal menses.  She has had complete evaluation 9 months ago which was all benign and reassuring.  On HRT.   PLAN  Will check [place] and estradiol again.  OK to continue with the Minivelle 0.0375 twice weekly and daily Prometrium 100 mg for now.  Final plan to follow.    An After Visit Summary was printed and given to the patient.  __15____ minutes face to face time of which over 50% was spent in counseling.

## 2016-09-08 LAB — ESTRADIOL: Estradiol: 26.4 pg/mL

## 2016-09-08 LAB — FOLLICLE STIMULATING HORMONE: FSH: 37.2 m[IU]/mL

## 2016-09-20 ENCOUNTER — Other Ambulatory Visit: Payer: Self-pay | Admitting: Obstetrics and Gynecology

## 2016-10-10 ENCOUNTER — Telehealth: Payer: Self-pay | Admitting: Obstetrics and Gynecology

## 2016-10-10 NOTE — Telephone Encounter (Signed)
LMTCB about canceled appt/rd patient is in recall.

## 2016-12-11 ENCOUNTER — Ambulatory Visit: Payer: 59 | Admitting: Obstetrics and Gynecology

## 2016-12-21 ENCOUNTER — Encounter: Payer: Self-pay | Admitting: Family Medicine

## 2017-01-26 ENCOUNTER — Encounter: Payer: Self-pay | Admitting: Obstetrics and Gynecology

## 2017-04-12 ENCOUNTER — Encounter: Payer: Self-pay | Admitting: Family Medicine

## 2017-10-31 ENCOUNTER — Ambulatory Visit (HOSPITAL_COMMUNITY)
Admission: EM | Admit: 2017-10-31 | Discharge: 2017-10-31 | Disposition: A | Discharge status: Home / Self Care | Payer: 59 | Attending: Internal Medicine | Admitting: Internal Medicine

## 2017-10-31 ENCOUNTER — Encounter (HOSPITAL_COMMUNITY): Payer: Self-pay | Admitting: Emergency Medicine

## 2017-10-31 ENCOUNTER — Other Ambulatory Visit: Payer: Self-pay

## 2017-10-31 DIAGNOSIS — H6690 Otitis media, unspecified, unspecified ear: Secondary | ICD-10-CM

## 2017-10-31 DIAGNOSIS — J0100 Acute maxillary sinusitis, unspecified: Secondary | ICD-10-CM | POA: Diagnosis not present

## 2017-10-31 MED ORDER — AMOXICILLIN-POT CLAVULANATE 875-125 MG PO TABS: 1.0000 | ORAL_TABLET | Freq: Two times a day (BID) | ORAL | 0 refills | Status: AC

## 2017-10-31 MED ORDER — CETIRIZINE-PSEUDOEPHEDRINE ER 5-120 MG PO TB12: 1.0000 | ORAL_TABLET | Freq: Every day | ORAL | 0 refills | Status: DC

## 2017-10-31 NOTE — ED Triage Notes (Signed)
Pt reports body aches, nasal congestion and ear fullness since last Friday.

## 2017-10-31 NOTE — ED Provider Notes (Signed)
[place]   [id number] 10/31/17 Arrival Time: 1550  SUBJECTIVE: History from: patient.  TIMIA CASSELMAN is a 52 y.o. female who presents with abrupt onset of worsening nasal congestion, sinus pain/pressure, PND, ear fullness, and persistent dry cough for 5 days.  Denies positive sick exposure or precipitating event.  Has tried tylenol severe cold and flu, delsum, and flonase with temporary relief.  Symptoms are made worse with leaning forward.  Reports previous symptoms in the past.   Complains of associated chills, subjective fever, fatigue, body aches, chest congestion, and nausea.  Denies rhinorrhea, sore throat, SOB, wheezing, chest pain, nausea, changes in bowel or bladder habits.    ROS: As per HPI.  Past Medical History:  Diagnosis Date  . Acute appendicitis 07/22/2013  . Allergy   . Arthritis    bilateral knees, back  . Complicated migraine 4/0/9811  . Elevated TSH 08/05/2011  . Frequent headaches   . Heart murmur    as a child   Past Surgical History:  Procedure Laterality Date  . FOREARM SURGERY     plates inserted then removed later  . LAPAROSCOPIC APPENDECTOMY N/A 07/22/2013   Procedure: APPENDECTOMY LAPAROSCOPIC;  Surgeon: Odis Hollingshead, MD;  Location: [facility];  Service: General;  Laterality: N/A;  . TONSILLECTOMY    . TUBAL LIGATION    . WISDOM TOOTH EXTRACTION     Allergies  Allergen Reactions  . Sulfa Antibiotics Shortness Of Breath and Swelling  . Codeine Itching  . Lo Loestrin Fe [Norethin-Eth Estrad-Fe Biphas] Other (See Comments)    Facial numbness, tingling sensation, rash.   No current facility-administered medications on file prior to encounter.    Current Outpatient Medications on File Prior to Encounter  Medication Sig Dispense Refill  . acetaminophen (TYLENOL) 500 MG tablet Take 500 mg by mouth every 6 (six) hours as needed.    Marland Kitchen BIOTIN 5000 PO Take by mouth daily.    Marland Kitchen estradiol (VIVELLE-DOT) 0.05 MG/24HR patch Place 1 patch  (0.05 mg total) onto the skin 2 (two) times a week. 8 patch 4  . Multiple Vitamin (MULTIVITAMIN) capsule Take 1 capsule by mouth daily.    . progesterone (PROMETRIUM) 100 MG capsule TAKE 1 CAPSULE(100 MG) BY MOUTH DAILY 30 capsule 4   Social History   Socioeconomic History  . Marital status: Married    Spouse name: Not on file  . Number of children: Not on file  . Years of education: Not on file  . Highest education level: Not on file  Occupational History  . Not on file  Social Needs  . Financial resource strain: Not on file  . Food insecurity:    Worry: Not on file    Inability: Not on file  . Transportation needs:    Medical: Not on file    Non-medical: Not on file  Tobacco Use  . Smoking status: Never Smoker  . Smokeless tobacco: Never Used  Substance and Sexual Activity  . Alcohol use: No    Alcohol/week: 0.0 oz  . Drug use: No  . Sexual activity: Yes    Partners: Male    Birth control/protection: Surgical    Comment: Tubal  Lifestyle  . Physical activity:    Days per week: Not on file    Minutes per session: Not on file  . Stress: Not on file  Relationships  . Social connections:    Talks on phone: Not on file    Gets together: Not on file  Attends religious service: Not on file    Active member of club or organization: Not on file    Attends meetings of clubs or organizations: Not on file    Relationship status: Not on file  . Intimate partner violence:    Fear of current or ex partner: Not on file    Emotionally abused: Not on file    Physically abused: Not on file    Forced sexual activity: Not on file  Other Topics Concern  . Not on file  Social History Narrative   Works at [employer]   1 child 17y.o. Living at home, lives with husband- married 7 years ago.    Christian   Starting exercise program; diet is healthy            Family History  Problem Relation Age of Onset  . Alcohol abuse Father   . Osteoporosis  Paternal Grandmother   . Cancer Maternal Grandfather     OBJECTIVE:  Vitals:   10/31/17 1603  BP: 140/72  Pulse: 81  Resp: 16  Temp: 98.2 F (36.8 C)  TempSrc: Oral  SpO2: 97%     General appearance: alert; appears fatigued HEENT: Ears: EACs clear, RT TM pearly gray with visible cone of light, without erythema, LT TM injected; Eyes: PERRL, EOMI grossly; left sided maxillary sinus tenderness; Nose: clear rhinorrhea; Throat: oropharynx mildly erythematous, tonsils 1+ without white tonsillar exudates, uvula midline Neck: supple without LAD Lungs: unlabored respirations, symmetrical air entry; cough: absent; no respiratory distress; CTAB without adventitious breath sounds Heart: regular rate and rhythm.  Radial pulses 2+ symmetrical bilaterally Skin: warm and dry Psychological: alert and cooperative; normal mood and affect   ASSESSMENT & PLAN:  1. Acute non-recurrent maxillary sinusitis   2. Acute otitis media, unspecified otitis media type     Meds ordered this encounter  Medications  . cetirizine-pseudoephedrine (ZYRTEC-D) 5-120 MG tablet    Sig: Take 1 tablet by mouth daily.    Dispense:  30 tablet    Refill:  0    Order Specific Question:   Supervising Provider    Answer:   Wynona Luna [id number]  . amoxicillin-clavulanate (AUGMENTIN) 875-125 MG tablet    Sig: Take 1 tablet by mouth every 12 (twelve) hours for 10 days.    Dispense:  20 tablet    Refill:  0    Order Specific Question:   Supervising Provider    Answer:   Wynona Luna [[id number]]   Rest and push fluids Zyrtec D prescribed.  Take daily for symptomatic relief Augmentin prescribed.  Take as directed and to completion Continue OTC ibuprofen and/or tylenol, as well as flonase, as needed for symptomatic relief Follow up with PCP if symptoms persists Return or go to the ED if you have any new or worsening symptoms  Reviewed expectations re: course of current medical issues. Questions  answered. Outlined signs and symptoms indicating need for more acute intervention. Patient verbalized understanding. After Visit Summary given.         Lestine Box, PA-C 10/31/17 1657

## 2017-10-31 NOTE — Discharge Instructions (Addendum)
Rest and push fluids Zyrtec D prescribed.  Take daily for symptomatic relief Augmentin prescribed.  Take as directed and to completion Continue OTC ibuprofen and/or tylenol, as well as flonase, as needed for symptomatic relief Follow up with PCP if symptoms persists Return or go to the ED if you have any new or worsening symptoms

## 2018-09-04 ENCOUNTER — Ambulatory Visit (INDEPENDENT_AMBULATORY_CARE_PROVIDER_SITE_OTHER)
Admission: RE | Admit: 2018-09-04 | Discharge: 2018-09-04 | Disposition: A | Discharge status: Home / Self Care | Payer: 59 | Source: Ambulatory Visit

## 2018-09-04 ENCOUNTER — Telehealth: Payer: 59

## 2018-09-04 DIAGNOSIS — J0100 Acute maxillary sinusitis, unspecified: Secondary | ICD-10-CM

## 2018-09-04 DIAGNOSIS — H44002 Unspecified purulent endophthalmitis, left eye: Secondary | ICD-10-CM

## 2018-09-04 MED ORDER — DOXYCYCLINE HYCLATE 100 MG PO CAPS: 100.0000 mg | ORAL_CAPSULE | Freq: Two times a day (BID) | ORAL | 0 refills | Status: DC

## 2018-09-04 NOTE — Discharge Instructions (Addendum)
Treating for infection vs infected stye of sinus infection.  Take the medication as prescribed.  Continue the warm compresses.  Flonase and zyrtec for allergies.  Follow up as needed for continued or worsening symptoms

## 2018-09-04 NOTE — ED Provider Notes (Signed)
Virtual Visit via Video Note:  Denise Reese  initiated request for Telemedicine visit with [facility] Urgent Care team. I connected with Denise Reese  on 09/04/2018 at 10:58 AM  for a synchronized telemedicine visit using a video enabled HIPPA compliant telemedicine application. I verified that I Loura Halt , am speaking with Denise Reese  using two identifiers. Orvan July, NP  was physically located in a [facility] Urgent care site and Denise Reese was located at a different location.   The limitations of evaluation and management by telemedicine as well as the availability of in-person appointments were discussed. Patient was informed that she  may incur a bill ( including co-pay) for this virtual visit encounter. Denise Reese  expressed understanding and gave verbal consent to proceed with virtual visit.     History of Present Illness:Denise Reese  is a 53 y.o. female presents with with 1 week or more  of worsening left eye irritation, sinus pressure and drainage.  Reporting stye to the left upper lid with yellow drainage.  There has been some itching.  No problems with vision.  Reporting a sensation of something draining in her sinuses on the left side and sinus pressure.  Denies any injury to the eye or foreign body.  She does have history of allergies and sinus infections.  She has been using warm compresses very often and Flonase for symptoms.  Denies any fevers, chills, body aches, cough or congestion.  No recent sick contacts or recent traveling.  Past Medical History:  Diagnosis Date  . Acute appendicitis 07/22/2013  . Allergy   . Arthritis    bilateral knees, back  . Complicated migraine 05/07/2681  . Elevated TSH 08/05/2011  . Frequent headaches   . Heart murmur    as a child    Allergies  Allergen Reactions  . Sulfa Antibiotics Shortness Of Breath and Swelling  . Codeine Itching  . Lo Loestrin Fe [Norethin Ace-Eth Estrad-Fe] Other (See Comments)   Facial numbness, tingling sensation, rash.        Observations/Objective:GENERAL APPEARANCE: Well developed, well nourished, alert and cooperative, and appears to be in no acute distress. HEAD: normocephalic. Eye-obvious swelling to the left upper and lower lid with redness.  Mild scleral injection.  Nose-sounds congested Non labored breathing, no dyspnea or distress Skin: Skin normal color  PSYCHIATRIC: The mental examination revealed the patient was oriented to person, place, and time. The patient was able to demonstrate good judgement and reason, without hallucinations, abnormal affect or abnormal behaviors during the examination. Patient is not suicidal     Assessment and Plan: infected stye with vs sinus infection or both. We will go ahead and treat with doxycycline and have her continue the warm compresses.  Flonase and Zyrtec for allergies.   Follow Up Instructions: Follow up as needed for continued or worsening symptoms     I discussed the assessment and treatment plan with the patient. The patient was provided an opportunity to ask questions and all were answered. The patient agreed with the plan and demonstrated an understanding of the instructions.   The patient was advised to call back or seek an in-person evaluation if the symptoms worsen or if the condition fails to improve as anticipated.     Orvan July, NP  09/04/2018 10:58 AM         Orvan July, NP 09/04/18 1434

## 2019-04-04 DIAGNOSIS — E559 Vitamin D deficiency, unspecified: Secondary | ICD-10-CM

## 2019-04-04 HISTORY — DX: Vitamin D deficiency, unspecified: E55.9

## 2020-01-19 ENCOUNTER — Ambulatory Visit (INDEPENDENT_AMBULATORY_CARE_PROVIDER_SITE_OTHER): Payer: 59 | Admitting: Obstetrics and Gynecology

## 2020-01-19 ENCOUNTER — Other Ambulatory Visit: Payer: Self-pay

## 2020-01-19 ENCOUNTER — Encounter: Payer: Self-pay | Admitting: Obstetrics and Gynecology

## 2020-01-19 VITALS — BP 124/78 | HR 70 | Ht 65.0 in | Wt 232.0 lb

## 2020-01-19 DIAGNOSIS — Z1211 Encounter for screening for malignant neoplasm of colon: Secondary | ICD-10-CM

## 2020-01-19 DIAGNOSIS — Z01419 Encounter for gynecological examination (general) (routine) without abnormal findings: Secondary | ICD-10-CM

## 2020-01-19 DIAGNOSIS — N951 Menopausal and female climacteric states: Secondary | ICD-10-CM | POA: Diagnosis not present

## 2020-01-19 NOTE — Progress Notes (Signed)
54 y.o. G74P1011 Married Caucasian female here for annual exam.    Patient is having cardiology work up for fatigue, chest discomfort,and lightheadedness. Has had negative stress test, echo and EKG.  PCP has worked up weight gain, "glare with vision".  Some increased heat and sweating during the day.   Denies vaginal bleeding.   Taking high dose vit D for low level.   Completed her Covid vaccine in March Pfizer.  Completed her flu shot last week.   PCP: Vira Browns, MD - [place]  Patient's last menstrual period was 08/31/2016 (exact date).           Sexually active: Yes.    The current method of family planning is tubal ligation.    Exercising: No.  just starting back--treatmill Smoker:  no  Health Maintenance: Pap:  12-09-15 Neg:Neg HR HPV, 10-24-12 Neg:Neg HR HPV, 09-22-11 Neg:Neg HR HPV History of abnormal Pap:  no MMG:  12-20-15 Neg/density B/BiRads1 Colonoscopy:  NEVER  DTO:6712'W  Result :Normal TDaP:  2013 Gardasil:   no HIV:Neg in pregnancy Hep C:unsure Screening Labs:  PCP and cardiology   reports that she has never smoked. She has never used smokeless tobacco. She reports that she does not drink alcohol and does not use drugs.  Past Medical History:  Diagnosis Date  . Acute appendicitis 07/22/2013  . Allergy   . Arthritis    bilateral knees, back  . Complicated migraine 08/08/996  . Elevated TSH 08/05/2011  . Frequent headaches   . Heart murmur    as a child  . Vitamin D deficiency 2021    Past Surgical History:  Procedure Laterality Date  . FOREARM SURGERY     plates inserted then removed later  . LAPAROSCOPIC APPENDECTOMY N/A 07/22/2013   Procedure: APPENDECTOMY LAPAROSCOPIC;  Surgeon: Odis Hollingshead, MD;  Location: [facility];  Service: General;  Laterality: N/A;  . TONSILLECTOMY    . TUBAL LIGATION    . WISDOM TOOTH EXTRACTION      Current Outpatient Medications  Medication Sig Dispense Refill  . Vitamin D, Ergocalciferol, (DRISDOL) 1.25 MG  (50000 UNIT) CAPS capsule Take 50,000 Units by mouth once a week.     No current facility-administered medications for this visit.    Family History  Problem Relation Age of Onset  . Alcohol abuse Father   . Osteoporosis Paternal Grandmother   . Cancer Maternal Grandfather     Review of Systems  All other systems reviewed and are negative.   Exam:   BP 124/78 (Cuff Size: Large)   Pulse 70   Ht 5\' 5"  (1.651 m)   Wt 232 lb (105.2 kg)   LMP 08/31/2016 (Exact Date)   SpO2 99%   BMI 38.61 kg/m     General appearance: alert, cooperative and appears stated age Head: normocephalic, without obvious abnormality, atraumatic Neck: no adenopathy, supple, symmetrical, trachea midline and thyroid normal to inspection and palpation Lungs: clear to auscultation bilaterally Breasts: normal appearance, no masses or tenderness, No nipple retraction or dimpling, No nipple discharge or bleeding, No axillary adenopathy Heart: regular rate and rhythm Abdomen: soft, non-tender; no masses, no organomegaly Extremities: extremities normal, atraumatic, no cyanosis or edema Skin: skin color, texture, turgor normal. No rashes or lesions Lymph nodes: cervical, supraclavicular, and axillary nodes normal. Neurologic: grossly normal  Pelvic: External genitalia:  no lesions              No abnormal inguinal nodes palpated.  Urethra:  normal appearing urethra with no masses, tenderness or lesions              Bartholins and Skenes: normal                 Vagina: normal appearing vagina with normal color and discharge, no lesions              Cervix: no lesions              Pap taken: No. Bimanual Exam:  Uterus:  normal size, contour, position, consistency, mobility, non-tender              Adnexa: no mass, fullness, tenderness              Rectal exam: Yes.  .  Confirms.              Anus:  normal sphincter tone, no lesions  Chaperone was present for exam.  Assessment:   Well woman visit  with normal exam. Menopausal symptoms.   Plan: Mammogram screening discussed.  She will update.  Self breast awareness reviewed. Pap and HR HPV next year.  Guidelines for Calcium, Vitamin D, regular exercise program including cardiovascular and weight bearing exercise. Check FSH and estradiol.  She will schedule her Covid booster.  Referral for colonoscopy.  Follow up annually and prn.

## 2020-01-19 NOTE — Patient Instructions (Signed)
EXERCISE AND DIET:  We recommended that you start or continue a regular exercise program for good health. Regular exercise means any activity that makes your heart beat faster and makes you sweat.  We recommend exercising at least 30 minutes per day at least 3 days a week, preferably 4 or 5.  We also recommend a diet low in fat and sugar.  Inactivity, poor dietary choices and obesity can cause diabetes, heart attack, stroke, and kidney damage, among others.    ALCOHOL AND SMOKING:  Women should limit their alcohol intake to no more than 7 drinks/beers/glasses of wine (combined, not each!) per week. Moderation of alcohol intake to this level decreases your risk of breast cancer and liver damage. And of course, no recreational drugs are part of a healthy lifestyle.  And absolutely no smoking or even second hand smoke. Most people know smoking can cause heart and lung diseases, but did you know it also contributes to weakening of your bones? Aging of your skin?  Yellowing of your teeth and nails?  CALCIUM AND VITAMIN D:  Adequate intake of calcium and Vitamin D are recommended.  The recommendations for exact amounts of these supplements seem to change often, but generally speaking 600 mg of calcium (either carbonate or citrate) and 800 units of Vitamin D per day seems prudent. Certain women may benefit from higher intake of Vitamin D.  If you are among these women, your doctor will have told you during your visit.    PAP SMEARS:  Pap smears, to check for cervical cancer or precancers,  have traditionally been done yearly, although recent scientific advances have shown that most women can have pap smears less often.  However, every woman still should have a physical exam from her gynecologist every year. It will include a breast check, inspection of the vulva and vagina to check for abnormal growths or skin changes, a visual exam of the cervix, and then an exam to evaluate the size and shape of the uterus and  ovaries.  And after 53 years of age, a rectal exam is indicated to check for rectal cancers. We will also provide age appropriate advice regarding health maintenance, like when you should have certain vaccines, screening for sexually transmitted diseases, bone density testing, colonoscopy, mammograms, etc.   MAMMOGRAMS:  All women over 40 years old should have a yearly mammogram. Many facilities now offer a "3D" mammogram, which may cost around $50 extra out of pocket. If possible,  we recommend you accept the option to have the 3D mammogram performed.  It both reduces the number of women who will be called back for extra views which then turn out to be normal, and it is better than the routine mammogram at detecting truly abnormal areas.    COLONOSCOPY:  Colonoscopy to screen for colon cancer is recommended for all women at age 50.  We know, you hate the idea of the prep.  We agree, BUT, having colon cancer and not knowing it is worse!!  Colon cancer so often starts as a polyp that can be seen and removed at colonscopy, which can quite literally save your life!  And if your first colonoscopy is normal and you have no family history of colon cancer, most women don't have to have it again for 10 years.  Once every ten years, you can do something that may end up saving your life, right?  We will be happy to help you get it scheduled when you are ready.    Be sure to check your insurance coverage so you understand how much it will cost.  It may be covered as a preventative service at no cost, but you should check your particular policy.      Mediterranean Diet A Mediterranean diet refers to food and lifestyle choices that are based on the traditions of countries located on the [place] Sea. This way of eating has been shown to help prevent certain conditions and improve outcomes for people who have chronic diseases, like kidney disease and heart disease. What are tips for following this  plan? Lifestyle  Cook and eat meals together with your family, when possible.  Drink enough fluid to keep your urine clear or pale yellow.  Be physically active every day. This includes: ? Aerobic exercise like running or swimming. ? Leisure activities like gardening, walking, or housework.  Get 7-8 hours of sleep each night.  If recommended by your health care provider, drink red wine in moderation. This means 1 glass a day for nonpregnant women and 2 glasses a day for men. A glass of wine equals 5 oz (150 mL). Reading food labels  Check the serving size of packaged foods. For foods such as rice and pasta, the serving size refers to the amount of cooked product, not dry.  Check the total fat in packaged foods. Avoid foods that have saturated fat or trans fats.  Check the ingredients list for added sugars, such as corn syrup.   Shopping  At the grocery store, buy most of your food from the areas near the walls of the store. This includes: ? Fresh fruits and vegetables (produce). ? Grains, beans, nuts, and seeds. Some of these may be available in unpackaged forms or large amounts (in bulk). ? Fresh seafood. ? Poultry and eggs. ? Low-fat dairy products.  Buy whole ingredients instead of prepackaged foods.  Buy fresh fruits and vegetables in-season from local farmers markets.  Buy frozen fruits and vegetables in resealable bags.  If you do not have access to quality fresh seafood, buy precooked frozen shrimp or canned fish, such as tuna, salmon, or sardines.  Buy small amounts of raw or cooked vegetables, salads, or olives from the deli or salad bar at your store.  Stock your pantry so you always have certain foods on hand, such as olive oil, canned tuna, canned tomatoes, rice, pasta, and beans. Cooking  Cook foods with extra-virgin olive oil instead of using butter or other vegetable oils.  Have meat as a side dish, and have vegetables or grains as your main dish. This  means having meat in small portions or adding small amounts of meat to foods like pasta or stew.  Use beans or vegetables instead of meat in common dishes like chili or lasagna.  Experiment with different cooking methods. Try roasting or broiling vegetables instead of steaming or sauteing them.  Add frozen vegetables to soups, stews, pasta, or rice.  Add nuts or seeds for added healthy fat at each meal. You can add these to yogurt, salads, or vegetable dishes.  Marinate fish or vegetables using olive oil, lemon juice, garlic, and fresh herbs. Meal planning  Plan to eat 1 vegetarian meal one day each week. Try to work up to 2 vegetarian meals, if possible.  Eat seafood 2 or more times a week.  Have healthy snacks readily available, such as: ? Vegetable sticks with hummus. ? Greek yogurt. ? Fruit and nut trail mix.  Eat balanced meals throughout the week. This   includes: ? Fruit: 2-3 servings a day ? Vegetables: 4-5 servings a day ? Low-fat dairy: 2 servings a day ? Fish, poultry, or lean meat: 1 serving a day ? Beans and legumes: 2 or more servings a week ? Nuts and seeds: 1-2 servings a day ? Whole grains: 6-8 servings a day ? Extra-virgin olive oil: 3-4 servings a day  Limit red meat and sweets to only a few servings a month   What are my food choices?  Mediterranean diet ? Recommended  Grains: Whole-grain pasta. Brown rice. Bulgar wheat. Polenta. Couscous. Whole-wheat bread. Oatmeal. Quinoa.  Vegetables: Artichokes. Beets. Broccoli. Cabbage. Carrots. Eggplant. Green beans. Chard. Kale. Spinach. Onions. Leeks. Peas. Squash. Tomatoes. Peppers. Radishes.  Fruits: Apples. Apricots. Avocado. Berries. Bananas. Cherries. Dates. Figs. Grapes. Lemons. Melon. Oranges. Peaches. Plums. Pomegranate.  Meats and other protein foods: Beans. Almonds. Sunflower seeds. Pine nuts. Peanuts. Cod. Salmon. Scallops. Shrimp. Tuna. Tilapia. Clams. Oysters. Eggs.  Dairy: Low-fat milk. Cheese.  Greek yogurt.  Beverages: Water. Red wine. Herbal tea.  Fats and oils: Extra virgin olive oil. Avocado oil. Grape seed oil.  Sweets and desserts: Greek yogurt with honey. Baked apples. Poached pears. Trail mix.  Seasoning and other foods: Basil. Cilantro. Coriander. Cumin. Mint. Parsley. Sage. Rosemary. Tarragon. Garlic. Oregano. Thyme. Pepper. Balsalmic vinegar. Tahini. Hummus. Tomato sauce. Olives. Mushrooms. ? Limit these  Grains: Prepackaged pasta or rice dishes. Prepackaged cereal with added sugar.  Vegetables: Deep fried potatoes (french fries).  Fruits: Fruit canned in syrup.  Meats and other protein foods: Beef. Pork. Lamb. Poultry with skin. Hot dogs. Bacon.  Dairy: Ice cream. Sour cream. Whole milk.  Beverages: Juice. Sugar-sweetened soft drinks. Beer. Liquor and spirits.  Fats and oils: Butter. Canola oil. Vegetable oil. Beef fat (tallow). Lard.  Sweets and desserts: Cookies. Cakes. Pies. Candy.  Seasoning and other foods: Mayonnaise. Premade sauces and marinades. The items listed may not be a complete list. Talk with your dietitian about what dietary choices are right for you. Summary  The Mediterranean diet includes both food and lifestyle choices.  Eat a variety of fresh fruits and vegetables, beans, nuts, seeds, and whole grains.  Limit the amount of red meat and sweets that you eat.  Talk with your health care provider about whether it is safe for you to drink red wine in moderation. This means 1 glass a day for nonpregnant women and 2 glasses a day for men. A glass of wine equals 5 oz (150 mL). This information is not intended to replace advice given to you by your health care provider. Make sure you discuss any questions you have with your health care provider. Document Revised: 11/18/2015 Document Reviewed: 11/11/2015 Elsevier Patient Education  2020 [employer]  

## 2020-01-20 ENCOUNTER — Other Ambulatory Visit: Payer: Self-pay | Admitting: Obstetrics and Gynecology

## 2020-01-20 DIAGNOSIS — Z1231 Encounter for screening mammogram for malignant neoplasm of breast: Secondary | ICD-10-CM

## 2020-01-20 LAB — ESTRADIOL: Estradiol: <5 pg/mL

## 2020-01-20 LAB — FOLLICLE STIMULATING HORMONE: FSH: 53.8 m[IU]/mL

## 2020-01-22 ENCOUNTER — Other Ambulatory Visit: Payer: Self-pay

## 2020-01-22 ENCOUNTER — Ambulatory Visit
Admission: RE | Admit: 2020-01-22 | Discharge: 2020-01-22 | Disposition: A | Discharge status: Home / Self Care | Payer: 59 | Source: Ambulatory Visit

## 2020-01-22 DIAGNOSIS — Z1231 Encounter for screening mammogram for malignant neoplasm of breast: Secondary | ICD-10-CM

## 2020-01-28 ENCOUNTER — Encounter: Payer: Self-pay | Admitting: Cardiovascular Disease

## 2020-02-03 ENCOUNTER — Ambulatory Visit (INDEPENDENT_AMBULATORY_CARE_PROVIDER_SITE_OTHER): Payer: BC Managed Care – PPO | Admitting: Cardiovascular Disease

## 2020-02-03 ENCOUNTER — Other Ambulatory Visit: Payer: Self-pay

## 2020-02-03 ENCOUNTER — Encounter: Payer: Self-pay | Admitting: Cardiovascular Disease

## 2020-02-03 VITALS — BP 102/76 | HR 67 | Ht 64.5 in | Wt 247.0 lb

## 2020-02-03 DIAGNOSIS — R079 Chest pain, unspecified: Secondary | ICD-10-CM

## 2020-02-03 DIAGNOSIS — Z01812 Encounter for preprocedural laboratory examination: Secondary | ICD-10-CM | POA: Diagnosis not present

## 2020-02-03 NOTE — H&P (View-Only) (Signed)
02/03/2020 Denise Reese   May 30, 1965  [id number]  Primary Physician Dulce Sellar, MD Primary Cardiologist: Lorretta Harp MD Lupe Carney, [place]  HPI:  Denise Reese is a 54 y.o. moderately overweight married Caucasian female mother of 1 child, grandmother of 1 grandchild who works in Clinical cytogeneticist doing injection molding.  She was referred by Dr. Mardene Speak, her cardiologist, for coronary angiography because of chest pain and an abnormal Myoview stress test notable for inferolateral reversible perfusion abnormality on 01/12/2020.  She has no cardiac risk factors.  She has noticed increasing dyspnea exertion, tachypalpitations and atypical chest pain in the recent past with a recent Myoview stress test that was abnormal.   Current Meds  Medication Sig  . amLODipine (NORVASC) 5 MG tablet Take 5 mg by mouth daily.  . ASPIRIN LOW DOSE 81 MG EC tablet Take 81 mg by mouth daily.  . Vitamin D, Ergocalciferol, (DRISDOL) 1.25 MG (50000 UNIT) CAPS capsule Take 50,000 Units by mouth once a week.     Allergies  Allergen Reactions  . Sular [Nisoldipine] Anaphylaxis  . Sulfa Antibiotics Shortness Of Breath and Swelling  . Codeine Itching  . Lo Loestrin Fe [Norethin Ace-Eth Estrad-Fe] Other (See Comments)    Facial numbness, tingling sensation, rash.    Social History   Socioeconomic History  . Marital status: Married    Spouse name: Not on file  . Number of children: Not on file  . Years of education: Not on file  . Highest education level: Not on file  Occupational History  . Not on file  Tobacco Use  . Smoking status: Never Smoker  . Smokeless tobacco: Never Used  Vaping Use  . Vaping Use: Never used  Substance and Sexual Activity  . Alcohol use: No    Alcohol/week: 0.0 standard drinks  . Drug use: No  . Sexual activity: Yes    Partners: Male    Birth control/protection: Surgical    Comment: Tubal  Other Topics Concern  . Not on file  Social History Narrative    Works at [employer]   1 child 17y.o. Living at home, lives with husband- married 7 years ago.    Christian   Starting exercise program; diet is healthy            Social Determinants of Health   Financial Resource Strain:   . Difficulty of Paying Living Expenses: Not on file  Food Insecurity:   . Worried About Charity fundraiser in the Last Year: Not on file  . Ran Out of Food in the Last Year: Not on file  Transportation Needs:   . Lack of Transportation (Medical): Not on file  . Lack of Transportation (Non-Medical): Not on file  Physical Activity:   . Days of Exercise per Week: Not on file  . Minutes of Exercise per Session: Not on file  Stress:   . Feeling of Stress : Not on file  Social Connections:   . Frequency of Communication with Friends and Family: Not on file  . Frequency of Social Gatherings with Friends and Family: Not on file  . Attends Religious Services: Not on file  . Active Member of Clubs or Organizations: Not on file  . Attends Archivist Meetings: Not on file  . Marital Status: Not on file  Intimate Partner Violence:   . Fear of Current or Ex-Partner: Not on file  . Emotionally Abused: Not on file  .  Physically Abused: Not on file  . Sexually Abused: Not on file     Review of Systems: General: negative for chills, fever, night sweats or weight changes.  Cardiovascular: negative for chest pain, dyspnea on exertion, edema, orthopnea, palpitations, paroxysmal nocturnal dyspnea or shortness of breath Dermatological: negative for rash Respiratory: negative for cough or wheezing Urologic: negative for hematuria Abdominal: negative for nausea, vomiting, diarrhea, bright red blood per rectum, melena, or hematemesis Neurologic: negative for visual changes, syncope, or dizziness All other systems reviewed and are otherwise negative except as noted above.    Blood pressure 102/76, pulse 67, height 5' 4.5" (1.638 m),  weight 247 lb (112 kg), last menstrual period 08/31/2016.  General appearance: alert and no distress Neck: no adenopathy, no carotid bruit, no JVD, supple, symmetrical, trachea midline and thyroid not enlarged, symmetric, no tenderness/mass/nodules Lungs: clear to auscultation bilaterally Heart: regular rate and rhythm, S1, S2 normal, no murmur, click, rub or gallop Extremities: extremities normal, atraumatic, no cyanosis or edema Pulses: 2+ and symmetric Skin: Skin color, texture, turgor normal. No rashes or lesions Neurologic: Alert and oriented X 3, normal strength and tone. Normal symmetric reflexes. Normal coordination and gait  EKG normal sinus rhythm at 67 without ST or T wave changes.  Personally reviewed this EKG.   ASSESSMENT AND PLAN:   Chest pain of uncertain etiology [name]  was referred to me by Dr. Brigitte Pulse for atypical chest pain and a recent Myoview stress test performed 01/12/2020 that showed a moderate size inferior, basal and inferolateral reversible defect on Myoview stress testing for diagnostic coronary angiography. I have reviewed the risks, indications, and alternatives to cardiac catheterization, possible angioplasty, and stenting with the patient. Risks include but are not limited to bleeding, infection, vascular injury, stroke, myocardial infection, arrhythmia, kidney injury, radiation-related injury in the case of prolonged fluoroscopy use, emergency cardiac surgery, and death. The patient understands the risks of serious complication is 1-2 in 3220 with diagnostic cardiac cath and 1-2% or less with angioplasty/stenting.       Lorretta Harp MD FACP,FACC,FAHA, [facility] 02/03/2020 3:47 PM

## 2020-02-03 NOTE — Patient Instructions (Addendum)
° ° °  [place] [address] Dept: [phone] Loc: [phone]  Denise Reese  02/03/2020  You are scheduled for a Cardiac Catheterization on Monday, November 8 with Dr. Quay Burow.  1. Please arrive at the [facility] (Main Entrance A) at [facility]: [address] [place], [place] [address] at 11:30 AM (This time is two hours before your procedure to ensure your preparation). Free valet parking service is available.   Special note: Every effort is made to have your procedure done on time. Please understand that emergencies sometimes delay scheduled procedures.  2. Diet: Do not eat solid foods after midnight.  The patient may have clear liquids until 5am upon the day of the procedure.  3. Labs: Today in office  COVID TEST:  Thursday 11/4 at 2:10 pm   This is a Drive Up Visit at [address] [place], [place] [address]. Someone will direct you to the appropriate testing line. Stay in your car and someone will be with you shortly.  4. Medication instructions in preparation for your procedure:   Contrast Allergy: No  On the morning of your procedure, take your Aspirin and any morning medicines NOT listed above.  You may use sips of water.  5. Plan for one night stay--bring personal belongings. 6. Bring a current list of your medications and current insurance cards. 7. You MUST have a responsible person to drive you home. 8. Someone MUST be with you the first 24 hours after you arrive home or your discharge will be delayed. 9. Please wear clothes that are easy to get on and off and wear slip-on shoes.  Thank you for allowing [place] to care for you!   -- [facility]   Follow up in 1 month with Dr. Gwenlyn Found

## 2020-02-03 NOTE — Progress Notes (Signed)
02/03/2020 Denise Reese   [birth year]  [id number]  Primary Physician Dulce Sellar, MD Primary Cardiologist: Lorretta Harp MD Lupe Carney, [place]  HPI:  Denise Reese is a 54 y.o. moderately overweight married Caucasian female mother of 1 child, grandmother of 1 grandchild who works in Clinical cytogeneticist doing injection molding.  She was referred by Dr. Mardene Speak, her cardiologist, for coronary angiography because of chest pain and an abnormal Myoview stress test notable for inferolateral reversible perfusion abnormality on 01/12/2020.  She has no cardiac risk factors.  She has noticed increasing dyspnea exertion, tachypalpitations and atypical chest pain in the recent past with a recent Myoview stress test that was abnormal.   Current Meds  Medication Sig  . amLODipine (NORVASC) 5 MG tablet Take 5 mg by mouth daily.  . ASPIRIN LOW DOSE 81 MG EC tablet Take 81 mg by mouth daily.  . Vitamin D, Ergocalciferol, (DRISDOL) 1.25 MG (50000 UNIT) CAPS capsule Take 50,000 Units by mouth once a week.     Allergies  Allergen Reactions  . Sular [Nisoldipine] Anaphylaxis  . Sulfa Antibiotics Shortness Of Breath and Swelling  . Codeine Itching  . Lo Loestrin Fe [Norethin Ace-Eth Estrad-Fe] Other (See Comments)    Facial numbness, tingling sensation, rash.    Social History   Socioeconomic History  . Marital status: Married    Spouse name: Not on file  . Number of children: Not on file  . Years of education: Not on file  . Highest education level: Not on file  Occupational History  . Not on file  Tobacco Use  . Smoking status: Never Smoker  . Smokeless tobacco: Never Used  Vaping Use  . Vaping Use: Never used  Substance and Sexual Activity  . Alcohol use: No    Alcohol/week: 0.0 standard drinks  . Drug use: No  . Sexual activity: Yes    Partners: Male    Birth control/protection: Surgical    Comment: Tubal  Other Topics Concern  . Not on file  Social History Narrative    Works at [employer]   1 child 17y.o. Living at home, lives with husband- married 7 years ago.    Christian   Starting exercise program; diet is healthy            Social Determinants of Health   Financial Resource Strain:   . Difficulty of Paying Living Expenses: Not on file  Food Insecurity:   . Worried About Charity fundraiser in the Last Year: Not on file  . Ran Out of Food in the Last Year: Not on file  Transportation Needs:   . Lack of Transportation (Medical): Not on file  . Lack of Transportation (Non-Medical): Not on file  Physical Activity:   . Days of Exercise per Week: Not on file  . Minutes of Exercise per Session: Not on file  Stress:   . Feeling of Stress : Not on file  Social Connections:   . Frequency of Communication with Friends and Family: Not on file  . Frequency of Social Gatherings with Friends and Family: Not on file  . Attends Religious Services: Not on file  . Active Member of Clubs or Organizations: Not on file  . Attends Archivist Meetings: Not on file  . Marital Status: Not on file  Intimate Partner Violence:   . Fear of Current or Ex-Partner: Not on file  . Emotionally Abused: Not on file  .  Physically Abused: Not on file  . Sexually Abused: Not on file     Review of Systems: General: negative for chills, fever, night sweats or weight changes.  Cardiovascular: negative for chest pain, dyspnea on exertion, edema, orthopnea, palpitations, paroxysmal nocturnal dyspnea or shortness of breath Dermatological: negative for rash Respiratory: negative for cough or wheezing Urologic: negative for hematuria Abdominal: negative for nausea, vomiting, diarrhea, bright red blood per rectum, melena, or hematemesis Neurologic: negative for visual changes, syncope, or dizziness All other systems reviewed and are otherwise negative except as noted above.    Blood pressure 102/76, pulse 67, height 5' 4.5" (1.638 m),  weight 247 lb (112 kg), last menstrual period 08/31/2016.  General appearance: alert and no distress Neck: no adenopathy, no carotid bruit, no JVD, supple, symmetrical, trachea midline and thyroid not enlarged, symmetric, no tenderness/mass/nodules Lungs: clear to auscultation bilaterally Heart: regular rate and rhythm, S1, S2 normal, no murmur, click, rub or gallop Extremities: extremities normal, atraumatic, no cyanosis or edema Pulses: 2+ and symmetric Skin: Skin color, texture, turgor normal. No rashes or lesions Neurologic: Alert and oriented X 3, normal strength and tone. Normal symmetric reflexes. Normal coordination and gait  EKG normal sinus rhythm at 67 without ST or T wave changes.  Personally reviewed this EKG.   ASSESSMENT AND PLAN:   Chest pain of uncertain etiology Ms. Hiller  was referred to me by Dr. Brigitte Pulse for atypical chest pain and a recent Myoview stress test performed 01/12/2020 that showed a moderate size inferior, basal and inferolateral reversible defect on Myoview stress testing for diagnostic coronary angiography. I have reviewed the risks, indications, and alternatives to cardiac catheterization, possible angioplasty, and stenting with the patient. Risks include but are not limited to bleeding, infection, vascular injury, stroke, myocardial infection, arrhythmia, kidney injury, radiation-related injury in the case of prolonged fluoroscopy use, emergency cardiac surgery, and death. The patient understands the risks of serious complication is 1-2 in 9233 with diagnostic cardiac cath and 1-2% or less with angioplasty/stenting.       Lorretta Harp MD FACP,FACC,FAHA, [facility] 02/03/2020 3:47 PM

## 2020-02-03 NOTE — Assessment & Plan Note (Signed)
Denise Reese  was referred to me by Dr. Brigitte Pulse for atypical chest pain and a recent Myoview stress test performed 01/12/2020 that showed a moderate size inferior, basal and inferolateral reversible defect on Myoview stress testing for diagnostic coronary angiography. I have reviewed the risks, indications, and alternatives to cardiac catheterization, possible angioplasty, and stenting with the patient. Risks include but are not limited to bleeding, infection, vascular injury, stroke, myocardial infection, arrhythmia, kidney injury, radiation-related injury in the case of prolonged fluoroscopy use, emergency cardiac surgery, and death. The patient understands the risks of serious complication is 1-2 in 0600 with diagnostic cardiac cath and 1-2% or less with angioplasty/stenting.

## 2020-02-04 LAB — BASIC METABOLIC PANEL
BUN/Creatinine Ratio: 17 (ref 9–23)
BUN: 14 mg/dL (ref 6–24)
CO2: 27 mmol/L (ref 20–29)
Calcium: 10 mg/dL (ref 8.7–10.2)
Chloride: 102 mmol/L (ref 96–106)
Creatinine, Ser: 0.82 mg/dL (ref 0.57–1.00)
GFR calc Af Amer: 94 mL/min/{1.73_m2} (ref >59)
GFR calc non Af Amer: 82 mL/min/{1.73_m2} (ref >59)
Glucose: 99 mg/dL (ref 65–99)
Potassium: 5.4 mmol/L — ABNORMAL HIGH (ref 3.5–5.2)
Sodium: 140 mmol/L (ref 134–144)

## 2020-02-04 LAB — CBC
Hematocrit: 41.2 % (ref 34.0–46.6)
Hemoglobin: 13.6 g/dL (ref 11.1–15.9)
MCH: 30.4 pg (ref 26.6–33.0)
MCHC: 33 g/dL (ref 31.5–35.7)
MCV: 92 fL (ref 79–97)
Platelets: 333 10*3/uL (ref 150–450)
RBC: 4.48 x10E6/uL (ref 3.77–5.28)
RDW: 12.8 % (ref 11.7–15.4)
WBC: 7.8 10*3/uL (ref 3.4–10.8)

## 2020-02-05 ENCOUNTER — Other Ambulatory Visit (HOSPITAL_COMMUNITY)
Admission: RE | Admit: 2020-02-05 | Discharge: 2020-02-05 | Disposition: A | Discharge status: Home / Self Care | Payer: BC Managed Care – PPO | Source: Ambulatory Visit | Attending: Cardiovascular Disease | Admitting: Cardiovascular Disease

## 2020-02-05 ENCOUNTER — Other Ambulatory Visit: Payer: Self-pay | Admitting: *Deleted

## 2020-02-05 DIAGNOSIS — R079 Chest pain, unspecified: Secondary | ICD-10-CM

## 2020-02-05 DIAGNOSIS — Z01818 Encounter for other preprocedural examination: Secondary | ICD-10-CM | POA: Insufficient documentation

## 2020-02-05 DIAGNOSIS — Z20822 Contact with and (suspected) exposure to covid-19: Secondary | ICD-10-CM | POA: Insufficient documentation

## 2020-02-05 LAB — SARS CORONAVIRUS 2 (TAT 6-24 HRS): SARS Coronavirus 2: NEGATIVE

## 2020-02-05 MED ORDER — SODIUM CHLORIDE 0.9% FLUSH: 3.0000 mL | Freq: Two times a day (BID) | INTRAVENOUS | Status: DC

## 2020-02-06 ENCOUNTER — Telehealth: Payer: Self-pay | Admitting: Cardiovascular Disease

## 2020-02-06 NOTE — Telephone Encounter (Signed)
° °  Precious Haws, per our Team Lead and Jeneen Rinks, we can't give clinical instructions because we are not clinicians.

## 2020-02-06 NOTE — Telephone Encounter (Signed)
New Message:     Pt is scheduled for Cath on Monday(02-09-20). She says she takes Aller-SLO 50 mg, she wants to know if this will interfere with anything?

## 2020-02-06 NOTE — Telephone Encounter (Signed)
Returned call to pt she states that she has no questions.

## 2020-02-06 NOTE — Telephone Encounter (Signed)
Aller-SLO 50 mg is just generic Flonase. Ok to continue to take

## 2020-02-06 NOTE — Telephone Encounter (Signed)
Pt is returning call.  

## 2020-02-09 ENCOUNTER — Ambulatory Visit (HOSPITAL_COMMUNITY)
Admission: RE | Admit: 2020-02-09 | Discharge: 2020-02-09 | Disposition: A | Discharge status: Home / Self Care | Payer: BC Managed Care – PPO | Attending: Cardiovascular Disease | Admitting: Cardiovascular Disease

## 2020-02-09 ENCOUNTER — Ambulatory Visit (HOSPITAL_COMMUNITY)
Admission: RE | Disposition: A | Discharge status: Home / Self Care | Payer: Self-pay | Source: Home / Self Care | Attending: Cardiovascular Disease

## 2020-02-09 ENCOUNTER — Other Ambulatory Visit: Payer: Self-pay

## 2020-02-09 DIAGNOSIS — Z885 Allergy status to narcotic agent status: Secondary | ICD-10-CM | POA: Diagnosis not present

## 2020-02-09 DIAGNOSIS — Z882 Allergy status to sulfonamides status: Secondary | ICD-10-CM | POA: Insufficient documentation

## 2020-02-09 DIAGNOSIS — R079 Chest pain, unspecified: Secondary | ICD-10-CM | POA: Diagnosis not present

## 2020-02-09 DIAGNOSIS — Z79899 Other long term (current) drug therapy: Secondary | ICD-10-CM | POA: Diagnosis not present

## 2020-02-09 HISTORY — PX: LEFT HEART CATH AND CORONARY ANGIOGRAPHY: CATH118249

## 2020-02-09 SURGERY — LEFT HEART CATH AND CORONARY ANGIOGRAPHY
Anesthesia: LOCAL

## 2020-02-09 MED ORDER — LIDOCAINE HCL (PF) 1 % IJ SOLN
INTRAMUSCULAR | Status: AC
  Filled 2020-02-09: qty 30

## 2020-02-09 MED ORDER — ONDANSETRON HCL 4 MG/2ML IJ SOLN: 4.0000 mg | Freq: Four times a day (QID) | INTRAMUSCULAR | Status: DC | PRN

## 2020-02-09 MED ORDER — FENTANYL CITRATE (PF) 100 MCG/2ML IJ SOLN
INTRAMUSCULAR | Status: AC
  Filled 2020-02-09: qty 2

## 2020-02-09 MED ORDER — SODIUM CHLORIDE 0.9 % IV SOLN: INTRAVENOUS | Status: AC

## 2020-02-09 MED ORDER — SODIUM CHLORIDE 0.9 % IV SOLN: 250.0000 mL | INTRAVENOUS | Status: DC | PRN

## 2020-02-09 MED ORDER — SODIUM CHLORIDE 0.9% FLUSH: 3.0000 mL | Freq: Two times a day (BID) | INTRAVENOUS | Status: DC

## 2020-02-09 MED ORDER — HEPARIN SODIUM (PORCINE) 1000 UNIT/ML IJ SOLN
INTRAMUSCULAR | Status: AC
  Filled 2020-02-09: qty 1

## 2020-02-09 MED ORDER — SODIUM CHLORIDE 0.9 % WEIGHT BASED INFUSION: 1.0000 mL/kg/h | INTRAVENOUS | Status: DC

## 2020-02-09 MED ORDER — IOHEXOL 350 MG/ML SOLN
INTRAVENOUS | Status: DC | PRN
  Administered 2020-02-09: 40 mL

## 2020-02-09 MED ORDER — HEPARIN (PORCINE) IN NACL 1000-0.9 UT/500ML-% IV SOLN
INTRAVENOUS | Status: DC | PRN
  Administered 2020-02-09 (×2): 500 mL

## 2020-02-09 MED ORDER — FENTANYL CITRATE (PF) 100 MCG/2ML IJ SOLN
INTRAMUSCULAR | Status: DC | PRN
  Administered 2020-02-09: 25 ug via INTRAVENOUS

## 2020-02-09 MED ORDER — MIDAZOLAM HCL 2 MG/2ML IJ SOLN
INTRAMUSCULAR | Status: AC
  Filled 2020-02-09: qty 2

## 2020-02-09 MED ORDER — NITROGLYCERIN 1 MG/10 ML FOR IR/CATH LAB
INTRA_ARTERIAL | Status: DC | PRN
  Administered 2020-02-09: 200 ug via INTRA_ARTERIAL

## 2020-02-09 MED ORDER — SODIUM CHLORIDE 0.9 % WEIGHT BASED INFUSION
3.0000 mL/kg/h | INTRAVENOUS | Status: AC
  Administered 2020-02-09: 3 mL/kg/h via INTRAVENOUS

## 2020-02-09 MED ORDER — ACETAMINOPHEN 325 MG PO TABS: 650.0000 mg | ORAL_TABLET | ORAL | Status: DC | PRN

## 2020-02-09 MED ORDER — SODIUM CHLORIDE 0.9% FLUSH: 3.0000 mL | INTRAVENOUS | Status: DC | PRN

## 2020-02-09 MED ORDER — LABETALOL HCL 5 MG/ML IV SOLN: 10.0000 mg | INTRAVENOUS | Status: DC | PRN

## 2020-02-09 MED ORDER — HEPARIN (PORCINE) IN NACL 1000-0.9 UT/500ML-% IV SOLN
INTRAVENOUS | Status: AC
  Filled 2020-02-09: qty 1000

## 2020-02-09 MED ORDER — VERAPAMIL HCL 2.5 MG/ML IV SOLN
INTRAVENOUS | Status: AC
  Filled 2020-02-09: qty 2

## 2020-02-09 MED ORDER — NITROGLYCERIN 1 MG/10 ML FOR IR/CATH LAB
INTRA_ARTERIAL | Status: AC
  Filled 2020-02-09: qty 10

## 2020-02-09 MED ORDER — MIDAZOLAM HCL 2 MG/2ML IJ SOLN
INTRAMUSCULAR | Status: DC | PRN
  Administered 2020-02-09: 1 mg via INTRAVENOUS

## 2020-02-09 MED ORDER — HEPARIN SODIUM (PORCINE) 1000 UNIT/ML IJ SOLN
INTRAMUSCULAR | Status: DC | PRN
  Administered 2020-02-09: 5000 [IU] via INTRAVENOUS

## 2020-02-09 MED ORDER — LIDOCAINE HCL (PF) 1 % IJ SOLN
INTRAMUSCULAR | Status: DC | PRN
  Administered 2020-02-09: 2 mL

## 2020-02-09 MED ORDER — ASPIRIN 81 MG PO CHEW: 81.0000 mg | CHEWABLE_TABLET | ORAL | Status: DC

## 2020-02-09 MED ORDER — HYDRALAZINE HCL 20 MG/ML IJ SOLN: 10.0000 mg | INTRAMUSCULAR | Status: DC | PRN

## 2020-02-09 SURGICAL SUPPLY — 12 items
CATH INFINITI 5 FR JL3.5 (CATHETERS) ×1 IMPLANT
CATH INFINITI 5FR ANG PIGTAIL (CATHETERS) ×1 IMPLANT
CATH OPTITORQUE TIG 4.0 5F (CATHETERS) ×1 IMPLANT
DEVICE RAD COMP TR BAND LRG (VASCULAR PRODUCTS) ×2 IMPLANT
GLIDESHEATH SLEND A-KIT 6F 22G (SHEATH) ×1 IMPLANT
GUIDEWIRE INQWIRE 1.5J.035X260 (WIRE) IMPLANT
INQWIRE 1.5J .035X260CM (WIRE) ×2
KIT HEART LEFT (KITS) ×2 IMPLANT
PACK CARDIAC CATHETERIZATION (CUSTOM PROCEDURE TRAY) ×2 IMPLANT
TRANSDUCER W/STOPCOCK (MISCELLANEOUS) ×2 IMPLANT
TUBING CIL FLEX 10 FLL-RA (TUBING) ×2 IMPLANT
WIRE HI TORQ VERSACORE-J 145CM (WIRE) ×1 IMPLANT

## 2020-02-09 NOTE — Discharge Instructions (Signed)
Drink plenty of fluid for 48 hours and keep wrist elevated at heart level for 24 hours  Radial Site Care   This sheet gives you information about how to care for yourself after your procedure. Your health care provider may also give you more specific instructions. If you have problems or questions, contact your health care provider. What can I expect after the procedure? After the procedure, it is common to have:  Bruising and tenderness at the catheter insertion area. Follow these instructions at home: Medicines  Take over-the-counter and prescription medicines only as told by your health care provider. Insertion site care 1. Follow instructions from your health care provider about how to take care of your insertion site. Make sure you: ? Wash your hands with soap and water before you change your bandage (dressing). If soap and water are not available, use hand sanitizer. ? remove your dressing as told by your health care provider. In 24 hours 2. Check your insertion site every day for signs of infection. Check for: ? Redness, swelling, or pain. ? Fluid or blood. ? Pus or a bad smell. ? Warmth. 3. Do not take baths, swim, or use a hot tub until your health care provider approves. 4. You may shower 24-48 hours after the procedure, or as directed by your health care provider. ? Remove the dressing and gently wash the site with plain soap and water. ? Pat the area dry with a clean towel. ? Do not rub the site. That could cause bleeding. 5. Do not apply powder or lotion to the site. Activity   1. For 24 hours after the procedure, or as directed by your health care provider: ? Do not flex or bend the affected arm. ? Do not push or pull heavy objects with the affected arm. ? Do not drive yourself home from the hospital or clinic. You may drive 24 hours after the procedure unless your health care provider tells you not to. ? Do not operate machinery or power tools. 2. Do not lift  anything that is heavier than 10 lb (4.5 kg), or the limit that you are told, until your health care provider says that it is safe. For 4 days 3. Ask your health care provider when it is okay to: ? Return to work or school. ? Resume usual physical activities or sports. ? Resume sexual activity. General instructions  If the catheter site starts to bleed, raise your arm and put firm pressure on the site. If the bleeding does not stop, get help right away. This is a medical emergency.  If you went home on the same day as your procedure, a responsible adult should be with you for the first 24 hours after you arrive home.  Keep all follow-up visits as told by your health care provider. This is important. Contact a health care provider if:  You have a fever.  You have redness, swelling, or yellow drainage around your insertion site. Get help right away if:  You have unusual pain at the radial site.  The catheter insertion area swells very fast.  The insertion area is bleeding, and the bleeding does not stop when you hold steady pressure on the area.  Your arm or hand becomes pale, cool, tingly, or numb. These symptoms may represent a serious problem that is an emergency. Do not wait to see if the symptoms will go away. Get medical help right away. Call your local emergency services (911 in the [place] Do not   drive yourself to the hospital. Summary  After the procedure, it is common to have bruising and tenderness at the site.  Follow instructions from your health care provider about how to take care of your radial site wound. Check the wound every day for signs of infection.  Do not lift anything that is heavier than 10 lb (4.5 kg), or the limit that you are told, until your health care provider says that it is safe. This information is not intended to replace advice given to you by your health care provider. Make sure you discuss any questions you have with your health care  provider. Document Revised: 04/25/2017 Document Reviewed: 04/25/2017 Elsevier Patient Education  2020 Elsevier Inc[employer]  

## 2020-02-09 NOTE — Interval H&P Note (Signed)
Cath Lab Visit (complete for each Cath Lab visit)  Clinical Evaluation Leading to the Procedure:   ACS: No.  Non-ACS:    Anginal Classification: CCS II  Anti-ischemic medical therapy: Minimal Therapy (1 class of medications)  Non-Invasive Test Results: Intermediate-risk stress test findings: cardiac mortality 1-3%/year  Prior CABG: No previous CABG      History and Physical Interval Note:  02/09/2020 1:05 PM  Denise Reese  has presented today for surgery, with the diagnosis of positive nuc.  The various methods of treatment have been discussed with the patient and family. After consideration of risks, benefits and other options for treatment, the patient has consented to  Procedure(s): LEFT HEART CATH AND CORONARY ANGIOGRAPHY (N/A) as a surgical intervention.  The patient's history has been reviewed, patient examined, no change in status, stable for surgery.  I have reviewed the patient's chart and labs.  Questions were answered to the patient's satisfaction.     Quay Burow

## 2020-02-09 NOTE — Progress Notes (Signed)
Patient was given discharge instructions. She verbalized understanding. 

## 2020-02-10 ENCOUNTER — Encounter (HOSPITAL_COMMUNITY): Payer: Self-pay | Admitting: Cardiovascular Disease

## 2020-02-10 ENCOUNTER — Encounter: Payer: 59 | Admitting: Internal Medicine

## 2020-02-10 MED FILL — Verapamil HCl IV Soln 2.5 MG/ML: INTRAVENOUS | Qty: 2 | Status: AC

## 2020-02-11 ENCOUNTER — Encounter: Payer: Self-pay | Admitting: Gastroenterology

## 2020-02-24 ENCOUNTER — Ambulatory Visit: Payer: BC Managed Care – PPO | Admitting: Cardiovascular Disease

## 2020-03-31 ENCOUNTER — Ambulatory Visit (AMBULATORY_SURGERY_CENTER): Payer: Self-pay | Admitting: *Deleted

## 2020-03-31 ENCOUNTER — Other Ambulatory Visit: Payer: Self-pay

## 2020-03-31 VITALS — Ht 64.5 in | Wt 238.0 lb

## 2020-03-31 DIAGNOSIS — Z1211 Encounter for screening for malignant neoplasm of colon: Secondary | ICD-10-CM

## 2020-03-31 MED ORDER — PEG 3350-KCL-NA BICARB-NACL 420 G PO SOLR: 4000.0000 mL | Freq: Once | ORAL | 0 refills | Status: AC

## 2020-03-31 NOTE — Progress Notes (Signed)
No egg or soy allergy known to patient  No issues with past sedation with any surgeries or procedures No intubation problems in the past  No FH of Malignant Hyperthermia No diet pills per patient No home 02 use per patient  No blood thinners per patient  Pt denies issues with constipation  No A fib or A flutter  EMMI video to pt or via MyChart  COVID 19 guidelines implemented in PV today with Pt and RN  Pt is fully vaccinated  for Covid   Plenvu  Coupon given to pt in PV today , Code to Pharmacy   Due to the COVID-19 pandemic we are asking patients to follow certain guidelines.  Pt aware of COVID protocols and [facility] guidelines   Pt given the option in PV today for Golytely prep verses  alternative prep  ( Suprep/Plenvu)-  Pt is aware the Golytely has more volume but is more cost effective and the Suprep/Plenvu is less volume but may cost $50-150.  Pt voiced understanding of this and choose Golytely  Prep.

## 2020-04-13 ENCOUNTER — Encounter: Payer: Self-pay | Admitting: Gastroenterology

## 2020-04-16 ENCOUNTER — Encounter: Payer: BC Managed Care – PPO | Admitting: Internal Medicine

## 2020-04-19 ENCOUNTER — Encounter: Payer: BC Managed Care – PPO | Admitting: Gastroenterology

## 2020-05-17 ENCOUNTER — Ambulatory Visit (AMBULATORY_SURGERY_CENTER): Payer: BC Managed Care – PPO | Admitting: Gastroenterology

## 2020-05-17 ENCOUNTER — Encounter: Payer: Self-pay | Admitting: Gastroenterology

## 2020-05-17 ENCOUNTER — Other Ambulatory Visit: Payer: Self-pay

## 2020-05-17 VITALS — BP 116/77 | HR 55 | Temp 97.5°F | Resp 14 | Ht 64.0 in | Wt 238.0 lb

## 2020-05-17 DIAGNOSIS — Z1211 Encounter for screening for malignant neoplasm of colon: Secondary | ICD-10-CM | POA: Diagnosis present

## 2020-05-17 DIAGNOSIS — D120 Benign neoplasm of cecum: Secondary | ICD-10-CM

## 2020-05-17 DIAGNOSIS — D124 Benign neoplasm of descending colon: Secondary | ICD-10-CM

## 2020-05-17 MED ORDER — SODIUM CHLORIDE 0.9 % IV SOLN: 500.0000 mL | INTRAVENOUS | Status: DC

## 2020-05-17 NOTE — Op Note (Signed)
[place] Patient Name: Denise Reese Procedure Date: 05/17/2020 1:58 PM MRN: [id number] Endoscopist: Thornton Park MD, MD Age: 55 Referring MD:  Date of Birth: Sep 10, 1965 Gender: Female Account #: [id number] Procedure:                Colonoscopy Indications:              Screening for colorectal malignant neoplasm, This                            is the patient's first colonoscopy                           No known family history of colon cancer or polyps                           No baseline GI symptoms Medicines:                Monitored Anesthesia Care Procedure:                Pre-Anesthesia Assessment:                           - Prior to the procedure, a History and Physical                            was performed, and patient medications and                            allergies were reviewed. The patient's tolerance of                            previous anesthesia was also reviewed. The risks                            and benefits of the procedure and the sedation                            options and risks were discussed with the patient.                            All questions were answered, and informed consent                            was obtained. Prior Anticoagulants: The patient has                            taken no previous anticoagulant or antiplatelet                            agents. ASA Grade Assessment: II - A patient with                            mild systemic disease. After reviewing the risks  and benefits, the patient was deemed in                            satisfactory condition to undergo the procedure.                           After obtaining informed consent, the colonoscope                            was passed under direct vision. Throughout the                            procedure, the patient's blood pressure, pulse, and                            oxygen saturations were monitored continuously.  The                            Olympus CF-[id number] (#[id number]) Colonoscope was                            introduced through the anus and advanced to the 3                            cm into the ileum. A second forward view of the                            right colon was performed. The colonoscopy was                            performed without difficulty. The patient tolerated                            the procedure well. The quality of the bowel                            preparation was good. The terminal ileum, ileocecal                            valve, appendiceal orifice, and rectum were                            photographed. Scope In: 2:08:46 PM Scope Out: 2:22:24 PM Scope Withdrawal Time: 0 hours 11 minutes 12 seconds  Total Procedure Duration: 0 hours 13 minutes 38 seconds  Findings:                 Non-bleeding internal hemorrhoids were found. The                            hemorrhoids were small.                           A 1 mm polyp was found in the descending colon. The  polyp was sessile. The polyp was removed with a                            cold snare. Resection and retrieval were complete.                            Estimated blood loss was minimal.                           A less than 1 mm polyp was found in the cecum. The                            polyp was sessile. The polyp was removed with a                            cold snare. Resection and retrieval were complete.                            Estimated blood loss was minimal.                           The exam was otherwise without abnormality on                            direct and retroflexion views. Complications:            No immediate complications. Estimated blood loss:                            Minimal. Estimated Blood Loss:     Estimated blood loss was minimal. Impression:               - Non-bleeding internal hemorrhoids.                           - One 1 mm polyp in  the descending colon, removed                            with a cold snare. Resected and retrieved.                           - One less than 1 mm polyp in the cecum, removed                            with a cold snare. Resected and retrieved.                           - The examination was otherwise normal on direct                            and retroflexion views. Recommendation:           - Patient has a contact number available for  emergencies. The signs and symptoms of potential                            delayed complications were discussed with the                            patient. Return to normal activities tomorrow.                            Written discharge instructions were provided to the                            patient.                           - Resume previous diet.                           - Continue present medications.                           - Await pathology results.                           - Repeat colonoscopy date to be determined after                            pending pathology results are reviewed for                            surveillance.                           - Emerging evidence supports eating a diet of                            fruits, vegetables, grains, calcium, and yogurt                            while reducing red meat and alcohol may reduce the                            risk of colon cancer.                           - Thank you for allowing me to be involved in your                            colon cancer prevention. Thornton Park MD, MD 05/17/2020 2:28:01 PM This report has been signed electronically.

## 2020-05-17 NOTE — Patient Instructions (Signed)
Handouts provided on polyps and hemorrhoids.   OU HAD AN ENDOSCOPIC PROCEDURE TODAY AT [facility]:   Refer to the procedure report that was given to you for any specific questions about what was found during the examination.  If the procedure report does not answer your questions, please call your gastroenterologist to clarify.  If you requested that your care partner not be given the details of your procedure findings, then the procedure report has been included in a sealed envelope for you to review at your convenience later.  YOU SHOULD EXPECT: Some feelings of bloating in the abdomen. Passage of more gas than usual.  Walking can help get rid of the air that was put into your GI tract during the procedure and reduce the bloating. If you had a lower endoscopy (such as a colonoscopy or flexible sigmoidoscopy) you may notice spotting of blood in your stool or on the toilet paper. If you underwent a bowel prep for your procedure, you may not have a normal bowel movement for a few days.  Please Note:  You might notice some irritation and congestion in your nose or some drainage.  This is from the oxygen used during your procedure.  There is no need for concern and it should clear up in a day or so.  SYMPTOMS TO REPORT IMMEDIATELY:   Following lower endoscopy (colonoscopy or flexible sigmoidoscopy):  Excessive amounts of blood in the stool  Significant tenderness or worsening of abdominal pains  Swelling of the abdomen that is new, acute  Fever of 100F or higher  For urgent or emergent issues, a gastroenterologist can be reached at any hour by calling [phone]. Do not use MyChart messaging for urgent concerns.    DIET:  We do recommend a small meal at first, but then you may proceed to your regular diet.  Drink plenty of fluids but you should avoid alcoholic beverages for 24 hours.  ACTIVITY:  You should plan to take it easy for the rest of today and you should NOT DRIVE  or use heavy machinery until tomorrow (because of the sedation medicines used during the test).    FOLLOW UP: Our staff will call the number listed on your records 48-72 hours following your procedure to check on you and address any questions or concerns that you may have regarding the information given to you following your procedure. If we do not reach you, we will leave a message.  We will attempt to reach you two times.  During this call, we will ask if you have developed any symptoms of COVID 19. If you develop any symptoms (ie: fever, flu-like symptoms, shortness of breath, cough etc.) before then, please call [phone].  If you test positive for Covid 19 in the 2 weeks post procedure, please call and report this information to [place].    If any biopsies were taken you will be contacted by phone or by letter within the next 1-3 weeks.  Please call us at [phone] if you have not heard about the biopsies in 3 weeks.    SIGNATURES/CONFIDENTIALITY: You and/or your care partner have signed paperwork which will be entered into your electronic medical record.  These signatures attest to the fact that that the information above on your After Visit Summary has been reviewed and is understood.  Full responsibility of the confidentiality of this discharge information lies with you and/or your care-partner.

## 2020-05-17 NOTE — Progress Notes (Signed)
Called to room to assist during endoscopic procedure.  Patient ID and intended procedure confirmed with present staff. Received instructions for my participation in the procedure from the performing physician.  

## 2020-05-17 NOTE — Progress Notes (Signed)
pt tolerated well. VSS. awake and to recovery. Report given to RN.  

## 2020-05-19 ENCOUNTER — Telehealth: Payer: Self-pay

## 2020-05-19 ENCOUNTER — Telehealth: Payer: Self-pay | Admitting: *Deleted

## 2020-05-19 NOTE — Telephone Encounter (Signed)
Follow up call, attempt one, unsuccessful. VM left. Will attempt to call again this afternoon.

## 2020-05-19 NOTE — Telephone Encounter (Signed)
No answer, left message to call if having any issues or concerns, B.Vickii Volland RN 

## 2020-05-28 ENCOUNTER — Encounter: Payer: Self-pay | Admitting: Gastroenterology
